# Patient Record
Sex: Female | Born: 1990 | Race: Black or African American | Hispanic: No | Marital: Single | Smoking: Former smoker
Health system: Southern US, Community
[De-identification: ages and names within clinical notes are randomized; demographics above are authoritative.]

## PROBLEM LIST (undated history)

## (undated) ENCOUNTER — Inpatient Hospital Stay (HOSPITAL_COMMUNITY): Payer: Self-pay

## (undated) DIAGNOSIS — F909 Attention-deficit hyperactivity disorder, unspecified type: Secondary | ICD-10-CM

## (undated) DIAGNOSIS — F32A Depression, unspecified: Secondary | ICD-10-CM

## (undated) DIAGNOSIS — E282 Polycystic ovarian syndrome: Secondary | ICD-10-CM

## (undated) DIAGNOSIS — E119 Type 2 diabetes mellitus without complications: Secondary | ICD-10-CM

## (undated) DIAGNOSIS — B009 Herpesviral infection, unspecified: Secondary | ICD-10-CM

## (undated) DIAGNOSIS — I10 Essential (primary) hypertension: Secondary | ICD-10-CM

## (undated) DIAGNOSIS — F419 Anxiety disorder, unspecified: Secondary | ICD-10-CM

## (undated) HISTORY — DX: Polycystic ovarian syndrome: E28.2

## (undated) HISTORY — DX: Herpesviral infection, unspecified: B00.9

## (undated) HISTORY — DX: Anxiety disorder, unspecified: F41.9

## (undated) HISTORY — PX: SHOULDER SURGERY: SHX246

## (undated) HISTORY — DX: Depression, unspecified: F32.A

## (undated) HISTORY — DX: Essential (primary) hypertension: I10

## (undated) HISTORY — DX: Type 2 diabetes mellitus without complications: E11.9

---

## 2017-10-30 DIAGNOSIS — L309 Dermatitis, unspecified: Secondary | ICD-10-CM | POA: Insufficient documentation

## 2019-07-31 ENCOUNTER — Encounter (HOSPITAL_COMMUNITY): Payer: Self-pay | Admitting: Emergency Medicine

## 2019-07-31 ENCOUNTER — Other Ambulatory Visit: Payer: Self-pay

## 2019-07-31 ENCOUNTER — Emergency Department (HOSPITAL_COMMUNITY)
Admission: EM | Admit: 2019-07-31 | Discharge: 2019-07-31 | Disposition: A | Discharge status: Home / Self Care | Payer: 59 | Attending: Emergency Medicine | Admitting: Emergency Medicine

## 2019-07-31 ENCOUNTER — Emergency Department (HOSPITAL_COMMUNITY): Payer: 59

## 2019-07-31 DIAGNOSIS — Z79899 Other long term (current) drug therapy: Secondary | ICD-10-CM | POA: Insufficient documentation

## 2019-07-31 DIAGNOSIS — Y999 Unspecified external cause status: Secondary | ICD-10-CM | POA: Insufficient documentation

## 2019-07-31 DIAGNOSIS — F909 Attention-deficit hyperactivity disorder, unspecified type: Secondary | ICD-10-CM | POA: Insufficient documentation

## 2019-07-31 DIAGNOSIS — S40211A Abrasion of right shoulder, initial encounter: Secondary | ICD-10-CM | POA: Insufficient documentation

## 2019-07-31 DIAGNOSIS — R51 Headache: Secondary | ICD-10-CM | POA: Insufficient documentation

## 2019-07-31 DIAGNOSIS — S161XXA Strain of muscle, fascia and tendon at neck level, initial encounter: Secondary | ICD-10-CM | POA: Diagnosis not present

## 2019-07-31 DIAGNOSIS — Y939 Activity, unspecified: Secondary | ICD-10-CM | POA: Insufficient documentation

## 2019-07-31 DIAGNOSIS — S50811A Abrasion of right forearm, initial encounter: Secondary | ICD-10-CM | POA: Diagnosis not present

## 2019-07-31 DIAGNOSIS — R109 Unspecified abdominal pain: Secondary | ICD-10-CM | POA: Insufficient documentation

## 2019-07-31 DIAGNOSIS — S80212A Abrasion, left knee, initial encounter: Secondary | ICD-10-CM | POA: Insufficient documentation

## 2019-07-31 DIAGNOSIS — S80211A Abrasion, right knee, initial encounter: Secondary | ICD-10-CM | POA: Insufficient documentation

## 2019-07-31 DIAGNOSIS — Y929 Unspecified place or not applicable: Secondary | ICD-10-CM | POA: Diagnosis not present

## 2019-07-31 DIAGNOSIS — M542 Cervicalgia: Secondary | ICD-10-CM | POA: Diagnosis present

## 2019-07-31 DIAGNOSIS — S0181XA Laceration without foreign body of other part of head, initial encounter: Secondary | ICD-10-CM | POA: Insufficient documentation

## 2019-07-31 DIAGNOSIS — T148XXA Other injury of unspecified body region, initial encounter: Secondary | ICD-10-CM

## 2019-07-31 DIAGNOSIS — R0789 Other chest pain: Secondary | ICD-10-CM | POA: Diagnosis not present

## 2019-07-31 HISTORY — DX: Attention-deficit hyperactivity disorder, unspecified type: F90.9

## 2019-07-31 LAB — CBC WITH DIFFERENTIAL/PLATELET
Abs Immature Granulocytes: 0.03 10*3/uL (ref 0.00–0.07)
Basophils Absolute: 0 10*3/uL (ref 0.0–0.1)
Basophils Relative: 0 %
Eosinophils Absolute: 0.1 10*3/uL (ref 0.0–0.5)
Eosinophils Relative: 2 %
HCT: 41.4 % (ref 36.0–46.0)
Hemoglobin: 13 g/dL (ref 12.0–15.0)
Immature Granulocytes: 1 %
Lymphocytes Relative: 43 %
Lymphs Abs: 2.6 10*3/uL (ref 0.7–4.0)
MCH: 26.2 pg (ref 26.0–34.0)
MCHC: 31.4 g/dL (ref 30.0–36.0)
MCV: 83.5 fL (ref 80.0–100.0)
Monocytes Absolute: 0.4 10*3/uL (ref 0.1–1.0)
Monocytes Relative: 7 %
Neutro Abs: 3 10*3/uL (ref 1.7–7.7)
Neutrophils Relative %: 47 %
Platelets: 377 10*3/uL (ref 150–400)
RBC: 4.96 MIL/uL (ref 3.87–5.11)
RDW: 13.2 % (ref 11.5–15.5)
WBC: 6.2 10*3/uL (ref 4.0–10.5)
nRBC: 0 % (ref 0.0–0.2)

## 2019-07-31 LAB — COMPREHENSIVE METABOLIC PANEL
ALT: 17 U/L (ref 0–44)
AST: 23 U/L (ref 15–41)
Albumin: 4.1 g/dL (ref 3.5–5.0)
Alkaline Phosphatase: 65 U/L (ref 38–126)
Anion gap: 11 (ref 5–15)
BUN: 15 mg/dL (ref 6–20)
CO2: 22 mmol/L (ref 22–32)
Calcium: 9.6 mg/dL (ref 8.9–10.3)
Chloride: 106 mmol/L (ref 98–111)
Creatinine, Ser: 1.21 mg/dL — ABNORMAL HIGH (ref 0.44–1.00)
GFR calc Af Amer: >60 mL/min (ref >60)
GFR calc non Af Amer: >60 mL/min (ref >60)
Glucose, Bld: 122 mg/dL — ABNORMAL HIGH (ref 70–99)
Potassium: 3.8 mmol/L (ref 3.5–5.1)
Sodium: 139 mmol/L (ref 135–145)
Total Bilirubin: 0.2 mg/dL — ABNORMAL LOW (ref 0.3–1.2)
Total Protein: 7.3 g/dL (ref 6.5–8.1)

## 2019-07-31 LAB — RAPID URINE DRUG SCREEN, HOSP PERFORMED
Amphetamines: POSITIVE — AB
Barbiturates: NOT DETECTED
Benzodiazepines: NOT DETECTED
Cocaine: NOT DETECTED
Opiates: NOT DETECTED
Tetrahydrocannabinol: NOT DETECTED

## 2019-07-31 LAB — I-STAT BETA HCG BLOOD, ED (MC, WL, AP ONLY): I-stat hCG, quantitative: <5 m[IU]/mL (ref <5)

## 2019-07-31 LAB — ETHANOL: Alcohol, Ethyl (B): 17 mg/dL — ABNORMAL HIGH (ref <10)

## 2019-07-31 MED ORDER — HYDROCODONE-ACETAMINOPHEN 5-325 MG PO TABS: 1.0000 | ORAL_TABLET | ORAL | 0 refills | Status: DC | PRN

## 2019-07-31 MED ORDER — METHOCARBAMOL 500 MG PO TABS: 500.0000 mg | ORAL_TABLET | Freq: Three times a day (TID) | ORAL | 0 refills | Status: DC | PRN

## 2019-07-31 MED ORDER — LIDOCAINE-EPINEPHRINE (PF) 2 %-1:200000 IJ SOLN
10.0000 mL | Freq: Once | INTRAMUSCULAR | Status: AC
  Administered 2019-07-31: 10 mL
  Filled 2019-07-31: qty 20

## 2019-07-31 MED ORDER — IOHEXOL 300 MG/ML  SOLN
100.0000 mL | Freq: Once | INTRAMUSCULAR | Status: AC | PRN
  Administered 2019-07-31: 100 mL via INTRAVENOUS

## 2019-07-31 NOTE — ED Notes (Signed)
Patient verbalizes understanding of discharge instructions . Opportunity for questions and answers were provided . Armband removed by staff ,Pt discharged from ED. W/C  offered at D/C  and Declined W/C at D/C and was escorted to lobby by RN.  
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
## 2019-07-31 NOTE — ED Notes (Signed)
Patient transported to CT SCAN . 
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
## 2019-08-19 DIAGNOSIS — F909 Attention-deficit hyperactivity disorder, unspecified type: Secondary | ICD-10-CM | POA: Insufficient documentation

## 2019-08-19 DIAGNOSIS — F419 Anxiety disorder, unspecified: Secondary | ICD-10-CM | POA: Insufficient documentation
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
## 2021-07-04 ENCOUNTER — Encounter (HOSPITAL_BASED_OUTPATIENT_CLINIC_OR_DEPARTMENT_OTHER): Payer: Self-pay

## 2021-07-04 ENCOUNTER — Emergency Department (HOSPITAL_BASED_OUTPATIENT_CLINIC_OR_DEPARTMENT_OTHER)
Admission: EM | Admit: 2021-07-04 | Discharge: 2021-07-04 | Disposition: A | Discharge status: Home / Self Care | Payer: No Typology Code available for payment source | Attending: Emergency Medicine | Admitting: Emergency Medicine

## 2021-07-04 ENCOUNTER — Other Ambulatory Visit: Payer: Self-pay

## 2021-07-04 DIAGNOSIS — Z79899 Other long term (current) drug therapy: Secondary | ICD-10-CM | POA: Diagnosis not present

## 2021-07-04 DIAGNOSIS — F1010 Alcohol abuse, uncomplicated: Secondary | ICD-10-CM | POA: Diagnosis not present

## 2021-07-04 DIAGNOSIS — R1084 Generalized abdominal pain: Secondary | ICD-10-CM | POA: Insufficient documentation

## 2021-07-04 DIAGNOSIS — R112 Nausea with vomiting, unspecified: Secondary | ICD-10-CM | POA: Insufficient documentation

## 2021-07-04 DIAGNOSIS — Y900 Blood alcohol level of less than 20 mg/100 ml: Secondary | ICD-10-CM | POA: Diagnosis not present

## 2021-07-04 DIAGNOSIS — R519 Headache, unspecified: Secondary | ICD-10-CM | POA: Diagnosis not present

## 2021-07-04 DIAGNOSIS — F17210 Nicotine dependence, cigarettes, uncomplicated: Secondary | ICD-10-CM | POA: Insufficient documentation

## 2021-07-04 LAB — CBC
HCT: 42.4 % (ref 36.0–46.0)
Hemoglobin: 13.7 g/dL (ref 12.0–15.0)
MCH: 26.8 pg (ref 26.0–34.0)
MCHC: 32.3 g/dL (ref 30.0–36.0)
MCV: 82.8 fL (ref 80.0–100.0)
Platelets: 376 10*3/uL (ref 150–400)
RBC: 5.12 MIL/uL — ABNORMAL HIGH (ref 3.87–5.11)
RDW: 13.3 % (ref 11.5–15.5)
WBC: 7.1 10*3/uL (ref 4.0–10.5)
nRBC: 0 % (ref 0.0–0.2)

## 2021-07-04 LAB — LIPASE, BLOOD: Lipase: 23 U/L (ref 11–51)

## 2021-07-04 LAB — COMPREHENSIVE METABOLIC PANEL
ALT: 25 U/L (ref 0–44)
AST: 26 U/L (ref 15–41)
Albumin: 4.4 g/dL (ref 3.5–5.0)
Alkaline Phosphatase: 67 U/L (ref 38–126)
Anion gap: 10 (ref 5–15)
BUN: 14 mg/dL (ref 6–20)
CO2: 24 mmol/L (ref 22–32)
Calcium: 8.9 mg/dL (ref 8.9–10.3)
Chloride: 105 mmol/L (ref 98–111)
Creatinine, Ser: 0.91 mg/dL (ref 0.44–1.00)
GFR, Estimated: >60 mL/min (ref >60)
Glucose, Bld: 132 mg/dL — ABNORMAL HIGH (ref 70–99)
Potassium: 4.2 mmol/L (ref 3.5–5.1)
Sodium: 139 mmol/L (ref 135–145)
Total Bilirubin: 0.3 mg/dL (ref 0.3–1.2)
Total Protein: 7.9 g/dL (ref 6.5–8.1)

## 2021-07-04 LAB — URINALYSIS, ROUTINE W REFLEX MICROSCOPIC
Bilirubin Urine: NEGATIVE
Glucose, UA: NEGATIVE mg/dL
Hgb urine dipstick: NEGATIVE
Ketones, ur: NEGATIVE mg/dL
Leukocytes,Ua: NEGATIVE
Nitrite: NEGATIVE
Protein, ur: NEGATIVE mg/dL
Specific Gravity, Urine: 1.015 (ref 1.005–1.030)
pH: 8 (ref 5.0–8.0)

## 2021-07-04 LAB — ETHANOL: Alcohol, Ethyl (B): 15 mg/dL — ABNORMAL HIGH (ref <10)

## 2021-07-04 LAB — PREGNANCY, URINE: Preg Test, Ur: NEGATIVE

## 2021-07-04 LAB — RAPID URINE DRUG SCREEN, HOSP PERFORMED
Amphetamines: NOT DETECTED
Barbiturates: NOT DETECTED
Benzodiazepines: NOT DETECTED
Cocaine: NOT DETECTED
Opiates: NOT DETECTED
Tetrahydrocannabinol: NOT DETECTED

## 2021-07-04 MED ORDER — ONDANSETRON 4 MG PO TBDP: 4.0000 mg | ORAL_TABLET | Freq: Three times a day (TID) | ORAL | 0 refills | Status: DC | PRN

## 2021-07-04 MED ORDER — SODIUM CHLORIDE 0.9 % IV BOLUS
1000.0000 mL | Freq: Once | INTRAVENOUS | Status: AC
  Administered 2021-07-04: 1000 mL via INTRAVENOUS

## 2021-07-04 MED ORDER — PROCHLORPERAZINE 25 MG RE SUPP
25.0000 mg | Freq: Once | RECTAL | Status: AC
  Administered 2021-07-04: 25 mg via RECTAL
  Filled 2021-07-04: qty 1

## 2021-07-04 MED ORDER — ONDANSETRON HCL 4 MG/2ML IJ SOLN
4.0000 mg | Freq: Once | INTRAMUSCULAR | Status: AC
  Administered 2021-07-04: 4 mg via INTRAVENOUS
  Filled 2021-07-04: qty 2

## 2021-07-04 MED ORDER — ONDANSETRON 4 MG PO TBDP
4.0000 mg | ORAL_TABLET | Freq: Once | ORAL | Status: AC | PRN
  Administered 2021-07-04: 4 mg via ORAL
  Filled 2021-07-04 (×2): qty 1

## 2021-07-04 MED ORDER — PROMETHAZINE HCL 25 MG RE SUPP
25.0000 mg | Freq: Once | RECTAL | Status: DC
  Filled 2021-07-04: qty 1
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
## 2021-07-04 NOTE — Discharge Instructions (Signed)
Your labs today are reassuring.  Recommend home to rest, stick with clear liquid diet and advance slowly as tolerated.  Take Zofran as needed for nausea and vomiting.  Recheck with your primary care provider.

## 2022-08-16 DIAGNOSIS — F418 Other specified anxiety disorders: Secondary | ICD-10-CM | POA: Insufficient documentation

## 2022-08-16 DIAGNOSIS — E282 Polycystic ovarian syndrome: Secondary | ICD-10-CM | POA: Insufficient documentation

## 2022-08-25 ENCOUNTER — Inpatient Hospital Stay (HOSPITAL_COMMUNITY)
Admission: AD | Admit: 2022-08-25 | Discharge: 2022-08-25 | Disposition: A | Discharge status: Home / Self Care | Payer: No Typology Code available for payment source | Attending: Obstetrics and Gynecology | Admitting: Obstetrics and Gynecology

## 2022-08-25 ENCOUNTER — Inpatient Hospital Stay (HOSPITAL_COMMUNITY): Payer: No Typology Code available for payment source

## 2022-08-25 ENCOUNTER — Encounter (HOSPITAL_COMMUNITY): Payer: Self-pay | Admitting: *Deleted

## 2022-08-25 DIAGNOSIS — R109 Unspecified abdominal pain: Secondary | ICD-10-CM | POA: Insufficient documentation

## 2022-08-25 DIAGNOSIS — O26891 Other specified pregnancy related conditions, first trimester: Secondary | ICD-10-CM | POA: Insufficient documentation

## 2022-08-25 DIAGNOSIS — O10919 Unspecified pre-existing hypertension complicating pregnancy, unspecified trimester: Secondary | ICD-10-CM | POA: Diagnosis present

## 2022-08-25 DIAGNOSIS — O039 Complete or unspecified spontaneous abortion without complication: Secondary | ICD-10-CM | POA: Diagnosis not present

## 2022-08-25 DIAGNOSIS — Z3A01 Less than 8 weeks gestation of pregnancy: Secondary | ICD-10-CM | POA: Insufficient documentation

## 2022-08-25 DIAGNOSIS — O209 Hemorrhage in early pregnancy, unspecified: Secondary | ICD-10-CM

## 2022-08-25 LAB — CBC
HCT: 34.6 % — ABNORMAL LOW (ref 36.0–46.0)
Hemoglobin: 11.2 g/dL — ABNORMAL LOW (ref 12.0–15.0)
MCH: 26.7 pg (ref 26.0–34.0)
MCHC: 32.4 g/dL (ref 30.0–36.0)
MCV: 82.6 fL (ref 80.0–100.0)
Platelets: 314 10*3/uL (ref 150–400)
RBC: 4.19 MIL/uL (ref 3.87–5.11)
RDW: 13.8 % (ref 11.5–15.5)
WBC: 7.3 10*3/uL (ref 4.0–10.5)
nRBC: 0 % (ref 0.0–0.2)

## 2022-08-25 LAB — WET PREP, GENITAL
Sperm: NONE SEEN
Trich, Wet Prep: NONE SEEN
WBC, Wet Prep HPF POC: <10 (ref <10)
Yeast Wet Prep HPF POC: NONE SEEN

## 2022-08-25 LAB — ABO/RH: ABO/RH(D): A POS

## 2022-08-25 LAB — HCG, QUANTITATIVE, PREGNANCY: hCG, Beta Chain, Quant, S: 2225 m[IU]/mL — ABNORMAL HIGH (ref <5)

## 2022-08-25 LAB — HIV ANTIBODY (ROUTINE TESTING W REFLEX): HIV Screen 4th Generation wRfx: NONREACTIVE

## 2022-08-25 NOTE — MAU Note (Signed)
G1P0 at 6.5 weeks date by LMP and early [place].  Reports spotting for the last two weeks with increased bleeding heavier than a period today that has returned to spotting currently. Some mild cramping.

## 2022-08-25 NOTE — Discharge Instructions (Signed)
Return to MAU: If you have heavier bleeding that soaks through more that 2 pads per hour for an hour or more If you bleed so much that you feel like you might pass out or you do pass out If you have significant abdominal pain that is not improved with Tylenol 1000 mg every 8 hours as needed for pain If you develop a fever > 100.5  

## 2022-08-25 NOTE — MAU Note (Signed)
Tammy [name] is a 31 y.o. at Unknown here in MAU reporting: had heavy cramps yesterday.  Woke up this morning, had a lot of blood. ("Soaked everything", has pantiliner on) Has been having spotting every day- started when missed period, only was seeing when she used the bathroom., Called the midwife and was told to come here. (Has had [place], showed IUP per pt). Mild cramping today. BP was really high yesterday.  LMP: 7/25 Onset of complaint: yesterday Pain score: mild Vitals:   08/25/22 1000  BP: (!) 146/97  Pulse: 76  Resp: 18  Temp: 98 F (36.7 C)  SpO2: 100%      Lab orders placed from triage:

## 2022-08-25 NOTE — MAU Provider Note (Signed)
History     CSN: [id number]  Arrival date and time: 08/25/22 5638   Event Date/Time   First Provider Initiated Contact with Patient 08/25/22 1113      Chief Complaint  Patient presents with   Vaginal Bleeding   Abdominal Pain   HPI [name] is a 31 y.o. year old G1P0 female at [redacted]w[redacted]d weeks gestation who presents to MAU reporting "heavy abdominal cramps since yesterday." She reports she woke up this AM with "a lot" of blood; "soaked everything." She is wearing pantiliner into MAU. She reports spotting every day, but only when she wipes in the BR until today. She denies any recent SI. She has a picture of an U/S showing an IUP from [address] [facility] on her phone. She reports mild cramping today 1-2/10. Her pregnancy is complicated by cHTN (no meds). She receives [facility]; next appt is 09/06/2022.   **U/S at [facility] on 08/16/2022  OB History     Gravida  1   Para      Term      Preterm      AB      Living         SAB      IAB      Ectopic      Multiple      Live Births              Past Medical History:  Diagnosis Date   ADHD     Past Surgical History:  Procedure Laterality Date   SHOULDER SURGERY      History reviewed. No pertinent family history.  Social History   Tobacco Use   Smoking status: Every Day    Types: Cigarettes   Smokeless tobacco: Never  Substance Use Topics   Alcohol use: Yes    Comment: weekly   Drug use: Not Currently    Allergies: No Known Allergies  No medications prior to admission.    Review of Systems  Constitutional: Negative.   HENT: Negative.    Eyes: Negative.   Respiratory: Negative.    Cardiovascular: Negative.   Gastrointestinal: Negative.   Endocrine: Negative.   Genitourinary:  Positive for pelvic pain (mild cramping) and vaginal bleeding ("soaking everything").  Musculoskeletal: Negative.   Skin: Negative.   Allergic/Immunologic: Negative.   Neurological:  Negative.   Hematological: Negative.   Psychiatric/Behavioral: Negative.     Physical Exam   Blood pressure (!) 156/97, pulse 95, temperature 98 F (36.7 C), temperature source Oral, resp. rate 17, height 5\' 8"  (1.727 m), weight 111.8 kg, last menstrual period 07/09/2022, SpO2 99 %.  Physical Exam Vitals and nursing note reviewed. Exam conducted with a chaperone present.  Constitutional:      Appearance: Normal appearance. She is obese.  Cardiovascular:     Rate and Rhythm: Normal rate.  Pulmonary:     Effort: Pulmonary effort is normal.  Abdominal:     Palpations: Abdomen is soft.  Skin:    General: Skin is warm and dry.  Neurological:     Mental Status: She is alert and oriented to person, place, and time.  Psychiatric:        Mood and Affect: Mood normal.        Behavior: Behavior normal.        Thought Content: Thought content normal.        Judgment: Judgment normal.     MAU Course  Procedures  MDM [facility] UPT CBC  ABO/Rh HCG Wet Prep GC/CT -- pending HIV -- pending OB < 14 wks [place] with TV  Results for orders placed or performed during the hospital encounter of 08/25/22 (from the past 24 hour(s))  Wet prep, genital     Status: Abnormal   Collection Time: 08/25/22 11:23 AM   Specimen: Vaginal  Result Value Ref Range   Yeast Wet Prep HPF POC NONE SEEN NONE SEEN   Trich, Wet Prep NONE SEEN NONE SEEN   Clue Cells Wet Prep HPF POC PRESENT (A) NONE SEEN   WBC, Wet Prep HPF POC <10 <10   Sperm NONE SEEN   CBC     Status: Abnormal   Collection Time: 08/25/22 11:38 AM  Result Value Ref Range   WBC 7.3 4.0 - 10.5 K/uL   RBC 4.19 3.87 - 5.11 MIL/uL   Hemoglobin 11.2 (L) 12.0 - 15.0 g/dL   HCT 34.6 (L) 36.0 - 46.0 %   MCV 82.6 80.0 - 100.0 fL   MCH 26.7 26.0 - 34.0 pg   MCHC 32.4 30.0 - 36.0 g/dL   RDW 13.8 11.5 - 15.5 %   Platelets 314 150 - 400 K/uL   nRBC 0.0 0.0 - 0.2 %  ABO/Rh     Status: None   Collection Time: 08/25/22 11:38 AM  Result Value Ref Range    ABO/RH(D) A POS    No rh immune globuloin      NOT A RH IMMUNE GLOBULIN CANDIDATE, PT RH POSITIVE Performed at [facility], [place] [address] [place], [place] [address]   hCG, quantitative, pregnancy     Status: Abnormal   Collection Time: 08/25/22 11:38 AM  Result Value Ref Range   hCG, Beta Chain, Quant, S 2,225 (H) <5 mIU/mL  HIV Antibody (routine testing w rflx)     Status: None   Collection Time: 08/25/22 11:38 AM  Result Value Ref Range   HIV Screen 4th Generation wRfx Non Reactive Non Reactive    US OB LESS THAN 14 WEEKS WITH OB TRANSVAGINAL  Result Date: 08/25/2022 CLINICAL DATA:  Vaginal bleeding in 1st trimester pregnancy. EXAM: OBSTETRIC <14 WK [place] AND TRANSVAGINAL OB US TECHNIQUE: Both transabdominal and transvaginal ultrasound examinations were performed for complete evaluation of the gestation as well as the maternal uterus, adnexal regions, and pelvic cul-de-sac. Transvaginal technique was performed to assess early pregnancy. COMPARISON:  None Available. FINDINGS: Intrauterine gestational sac: None Maternal uterus/adnexae: Endometrium is heterogeneous in appearance and measures 15 mm in thickness. Both ovaries are normal in appearance. No adnexal mass or abnormal free fluid identified. IMPRESSION: Pregnancy of unknown anatomic location (no intrauterine gestational sac or adnexal mass identified). Differential diagnosis includes recent spontaneous abortion, IUP too early to visualize, and non-visualized ectopic pregnancy. Recommend correlation with serial beta-hCG levels, and follow up US if warranted clinically. Electronically Signed   By: Marlaine Hind M.D.   On: 08/25/2022 12:28      Assessment and Plan  1. Miscarriage - Information provided on miscarriage and managing pregnancy loss  2. Vaginal bleeding affecting early pregnancy - Return to MAU: If you have heavier bleeding that soaks through more that 2 pads per hour for an hour or more If you bleed so much that  you feel like you might pass out or you do pass out If you have significant abdominal pain that is not improved with Tylenol 1000 mg every 8 hours as needed for pain If you develop a fever > 100.5   3.  [redacted] weeks gestation of pregnancy   - Discharge patient - Will need to f/u with [facility] on 09/02/2022 to start weekly HCG levels - Patient verbalized an understanding of the plan of care and agrees.   Raelyn Mora, CNM 08/25/2022, 11:13 AM

## 2022-08-26 LAB — GC/CHLAMYDIA PROBE AMP (~~LOC~~) NOT AT ARMC
Chlamydia: NEGATIVE
Comment: NEGATIVE
Comment: NORMAL
Neisseria Gonorrhea: NEGATIVE

## 2022-08-28 DIAGNOSIS — A6000 Herpesviral infection of urogenital system, unspecified: Secondary | ICD-10-CM | POA: Insufficient documentation

## 2022-12-16 NOTE — L&D Delivery Note (Signed)
Delivery Note  Called to room due to patient feeling the urge to push. Upon my arrival, the fetus was delivered and at the perineum. Cord doubly clamped and cut. Due to the fragility of the umbilical cord, it avulsed from the placenta in an attempt to move it. Hemabate IM x 1 dose was given to aid with delivery of the placenta. Placenta still in utero. Cervix, vagina, perineum and labia were inspected and no lacerations noted. Uterus fundus firm.  After about 10 minutes, her pads were weighed after continual passage of blood clots. The current QBL is 1000cc. Patient re-evaluated at bedside. Bedside US performed and unable to visualize the placenta within the endometrial cavity. On exam, placenta and blood clot palpated coming through the cervical os. No active hemorrhage but blood clots were extracted. Another dose of Hemabate IM x 1 was given. IVF 1L was started. Patient desired to sit on the commode to aid with pushing out the placenta and was assisted to the bathroom.  I counseled the patient that if placenta does not delivery after second dose of Hemabate, recommend Dilation and suction curettage. We discussed the risk for infection, bleeding, injury to surrounding organs, and risk for the need for blood transfusion. Patient was consented for blood products.  The patient is aware that bleeding may result in the need for a blood transfusion which includes risk of transmission of HIV (1:2 million), Hepatitis C (1:2 million), and Hepatitis B (1:200 thousand) and transfusion reaction.  Patient voiced understanding of the above risks as well as understanding of indications for blood transfusion.  Patient is agreeable to proceed with Dilation and Suction curettage if necessary. Dr. Reesa Chew arrived to assume care - see her note for further details.  Steva Ready, DO

## 2023-02-10 DIAGNOSIS — G43109 Migraine with aura, not intractable, without status migrainosus: Secondary | ICD-10-CM | POA: Insufficient documentation

## 2023-06-23 ENCOUNTER — Inpatient Hospital Stay (HOSPITAL_COMMUNITY)
Admission: AD | Admit: 2023-06-23 | Discharge: 2023-06-23 | Disposition: A | Discharge status: Home / Self Care | Payer: Medicaid Other | Attending: Family Medicine | Admitting: Family Medicine

## 2023-06-23 ENCOUNTER — Encounter (HOSPITAL_COMMUNITY): Payer: Self-pay | Admitting: *Deleted

## 2023-06-23 ENCOUNTER — Inpatient Hospital Stay (HOSPITAL_COMMUNITY): Payer: Medicaid Other

## 2023-06-23 DIAGNOSIS — O26891 Other specified pregnancy related conditions, first trimester: Secondary | ICD-10-CM | POA: Insufficient documentation

## 2023-06-23 DIAGNOSIS — O23591 Infection of other part of genital tract in pregnancy, first trimester: Secondary | ICD-10-CM | POA: Insufficient documentation

## 2023-06-23 DIAGNOSIS — O209 Hemorrhage in early pregnancy, unspecified: Secondary | ICD-10-CM | POA: Diagnosis not present

## 2023-06-23 DIAGNOSIS — B9689 Other specified bacterial agents as the cause of diseases classified elsewhere: Secondary | ICD-10-CM | POA: Diagnosis not present

## 2023-06-23 DIAGNOSIS — N760 Acute vaginitis: Secondary | ICD-10-CM

## 2023-06-23 DIAGNOSIS — Z3A13 13 weeks gestation of pregnancy: Secondary | ICD-10-CM | POA: Insufficient documentation

## 2023-06-23 LAB — WET PREP, GENITAL
Sperm: NONE SEEN
Trich, Wet Prep: NONE SEEN
WBC, Wet Prep HPF POC: >=10 — AB (ref <10)
Yeast Wet Prep HPF POC: NONE SEEN

## 2023-06-23 LAB — URINALYSIS, ROUTINE W REFLEX MICROSCOPIC
Bilirubin Urine: NEGATIVE
Glucose, UA: NEGATIVE mg/dL
Hgb urine dipstick: NEGATIVE
Ketones, ur: NEGATIVE mg/dL
Leukocytes,Ua: NEGATIVE
Nitrite: NEGATIVE
Protein, ur: NEGATIVE mg/dL
Specific Gravity, Urine: 1.027 (ref 1.005–1.030)
pH: 5 (ref 5.0–8.0)

## 2023-06-23 LAB — POCT PREGNANCY, URINE: Preg Test, Ur: POSITIVE — AB

## 2023-06-23 MED ORDER — METRONIDAZOLE 500 MG PO TABS: 500.0000 mg | ORAL_TABLET | Freq: Two times a day (BID) | ORAL | 0 refills | Status: AC

## 2023-06-23 MED ORDER — METRONIDAZOLE 500 MG PO TABS: 500.0000 mg | ORAL_TABLET | Freq: Two times a day (BID) | ORAL | 0 refills | Status: DC

## 2023-06-23 NOTE — MAU Note (Signed)
Pt says she has vag spotting- brown - when she wipes- started spotting since last cycle - x3 weeks  Cramping started today - had small blood clot- dime size .  2/10- no meds

## 2023-06-23 NOTE — MAU Provider Note (Signed)
History     [id number]  Arrival date and time: 06/23/23 1703    Chief Complaint  Patient presents with   Vaginal Bleeding   Abdominal Pain   HPI Tammy [name] is a 32 y.o. at [redacted]w[redacted]d by LMP who presents for vaginal bleeding and abdominal cramping. She reports experiencing mild vaginal bleeding for most of her pregnancy.  She would often have a blood tinge when she wipes after using the bathroom.  However, she was concerned because she had some small blood clots earlier today and experienced abdominal cramping. She spoke with her OB office ([facility]) and was told to come in for evaluation.   Review of outside prenatal records from [employer] (in media tab): no records available for review.  --/--/A POS (09/10 1138)  OB History     Gravida  2   Para      Term      Preterm      AB      Living         SAB      IAB      Ectopic      Multiple      Live Births              Past Medical History:  Diagnosis Date   ADHD     Past Surgical History:  Procedure Laterality Date   SHOULDER SURGERY      History reviewed. No pertinent family history.  Social History   Socioeconomic History   Marital status: Single    Spouse name: Not on file   Number of children: Not on file   Years of education: Not on file   Highest education level: Not on file  Occupational History   Not on file  Tobacco Use   Smoking status: Every Day    Types: Cigarettes   Smokeless tobacco: Never  Substance and Sexual Activity   Alcohol use: Yes    Comment: weekly   Drug use: Not Currently   Sexual activity: Not on file  Other Topics Concern   Not on file  Social History Narrative   Not on file   Social Determinants of Health   Financial Resource Strain: Not on file  Food Insecurity: Not on file  Transportation Needs: Not on file  Physical Activity: Not on file  Stress: Not on file  Social Connections: Not on file  Intimate Partner Violence: Not on file    No Known  Allergies  No current facility-administered medications on file prior to encounter.   Current Outpatient Medications on File Prior to Encounter  Medication Sig Dispense Refill   ADDERALL XR 25 MG 24 hr capsule Take 1 tablet by mouth daily.     buPROPion (WELLBUTRIN XL) 150 MG 24 hr tablet Take 1 tablet by mouth daily.     ondansetron (ZOFRAN ODT) 4 MG disintegrating tablet Take 1 tablet (4 mg total) by mouth every 8 (eight) hours as needed for nausea or vomiting. 12 tablet 0     Review of Systems  Constitutional:  Negative for chills and fever.  Eyes:  Negative for blurred vision.  Cardiovascular:  Negative for leg swelling.  Gastrointestinal:  Positive for abdominal pain. Negative for vomiting.  Genitourinary:  Negative for dysuria, frequency and urgency.  Musculoskeletal:  Negative for myalgias.  Skin:  Negative for itching.  Neurological:  Negative for dizziness.   Pertinent positives and negative per HPI, all others reviewed and negative  Physical Exam  BP 116/68 (BP Location: Right Arm)   Pulse 73   Temp 98.4 F (36.9 C) (Oral)   Resp 16   Ht 5\' 8"  (1.727 m)   Wt 112.9 kg   LMP 03/23/2023   Breastfeeding Unknown   BMI 37.86 kg/m   Patient Vitals for the past 24 hrs:  BP Temp Temp src Pulse Resp Height Weight  06/23/23 1822 116/68 98.4 F (36.9 C) Oral 73 16 5\' 8"  (1.727 m) 112.9 kg    Physical Exam Vitals reviewed.  Constitutional:      General: She is not in acute distress.    Appearance: She is well-developed. She is not toxic-appearing.  HENT:     Head: Normocephalic and atraumatic.     Mouth/Throat:     Mouth: Mucous membranes are moist.  Eyes:     Extraocular Movements: Extraocular movements intact.  Cardiovascular:     Rate and Rhythm: Normal rate.  Pulmonary:     Effort: Pulmonary effort is normal. No respiratory distress.  Abdominal:     Palpations: Abdomen is soft.     Tenderness: There is no abdominal tenderness.  Skin:    General: Skin  is warm and dry.  Neurological:     Mental Status: She is alert and oriented to person, place, and time.  Psychiatric:        Mood and Affect: Mood normal.        Behavior: Behavior normal.     Labs Results for orders placed or performed during the hospital encounter of 06/23/23 (from the past 24 hour(s))  Pregnancy, urine POC     Status: Abnormal   Collection Time: 06/23/23  5:28 PM  Result Value Ref Range   Preg Test, Ur POSITIVE (A) NEGATIVE  Urinalysis, Routine w reflex microscopic -Urine, Clean Catch     Status: None   Collection Time: 06/23/23  5:50 PM  Result Value Ref Range   Color, Urine YELLOW YELLOW   APPearance CLEAR CLEAR   Specific Gravity, Urine 1.027 1.005 - 1.030   pH 5.0 5.0 - 8.0   Glucose, UA NEGATIVE NEGATIVE mg/dL   Hgb urine dipstick NEGATIVE NEGATIVE   Bilirubin Urine NEGATIVE NEGATIVE   Ketones, ur NEGATIVE NEGATIVE mg/dL   Protein, ur NEGATIVE NEGATIVE mg/dL   Nitrite NEGATIVE NEGATIVE   Leukocytes,Ua NEGATIVE NEGATIVE  Wet prep, genital     Status: Abnormal   Collection Time: 06/23/23  6:27 PM  Result Value Ref Range   Yeast Wet Prep HPF POC NONE SEEN NONE SEEN   Trich, Wet Prep NONE SEEN NONE SEEN   Clue Cells Wet Prep HPF POC PRESENT (A) NONE SEEN   WBC, Wet Prep HPF POC >=10 (A) <10   Sperm NONE SEEN     Imaging US OB Comp Less 14 Wks  Result Date: 06/23/2023 CLINICAL DATA:  Spotting x3 weeks. EXAM: OBSTETRIC <14 WK ULTRASOUND TECHNIQUE: Transabdominal ultrasound was performed for evaluation of the gestation as well as the maternal uterus and adnexal regions. COMPARISON:  August 25, 2022 FINDINGS: Intrauterine gestational sac: Single Yolk sac:  Not Visualized. Embryo:  Visualized. Cardiac Activity: Visualized. Heart Rate: 151 bpm CRL:   59.3 mm   12 w 3 d                  [place] EDC: January 02, 2024 Subchorionic hemorrhage:  None visualized. Maternal uterus/adnexae: The bilateral ovaries are visualized and are normal in appearance. No pelvic free  fluid is seen. IMPRESSION: Single, viable  intrauterine pregnancy at approximately 12 weeks and 3 days gestation by ultrasound evaluation. Electronically Signed   By: Aram Candela M.D.   On: 06/23/2023 20:16    MAU Course  Procedures Lab Orders         Wet prep, genital         Urinalysis, Routine w reflex microscopic -Urine, Clean Catch         Pregnancy, urine POC    Meds ordered this encounter  Medications   DISCONTD: metroNIDAZOLE (FLAGYL) 500 MG tablet    Sig: Take 1 tablet (500 mg total) by mouth 2 (two) times daily for 7 days.    Dispense:  14 tablet    Refill:  0   metroNIDAZOLE (FLAGYL) 500 MG tablet    Sig: Take 1 tablet (500 mg total) by mouth 2 (two) times daily for 7 days.    Dispense:  14 tablet    Refill:  0   Imaging Orders         US OB Comp Less 14 Wks     MDM Moderate (Level 3-4)  Assessment and Plan  #Vaginal bleeding affecting early pregnancy [redacted] weeks gestation of pregnancy [place] reassuring for healthy intrauterine pregnancy. Dates consistent with LMP. - continue to monitor - follow up with OB provider - Discussed return precautions  Bacterial vaginosis -rx for metronidazole sent.  Dispo: discharged to home in stable condition   Allergies as of 06/23/2023   No Known Allergies      Medication List     TAKE these medications    Adderall XR 25 MG 24 hr capsule Generic drug: amphetamine-dextroamphetamine Take 1 tablet by mouth daily.   buPROPion 150 MG 24 hr tablet Commonly known as: WELLBUTRIN XL Take 1 tablet by mouth daily.   metroNIDAZOLE 500 MG tablet Commonly known as: Flagyl Take 1 tablet (500 mg total) by mouth 2 (two) times daily for 7 days.   ondansetron 4 MG disintegrating tablet Commonly known as: Zofran ODT Take 1 tablet (4 mg total) by mouth every 8 (eight) hours as needed for nausea or vomiting.        Sheppard Evens MD MPH OB Fellow, Faculty Practice [facility] 06/23/2023

## 2023-06-24 LAB — GC/CHLAMYDIA PROBE AMP (~~LOC~~) NOT AT ARMC
Chlamydia: NEGATIVE
Comment: NEGATIVE
Comment: NORMAL
Neisseria Gonorrhea: NEGATIVE

## 2023-07-08 ENCOUNTER — Other Ambulatory Visit: Payer: Self-pay | Admitting: Obstetrics and Gynecology

## 2023-07-08 ENCOUNTER — Telehealth: Payer: Self-pay

## 2023-07-08 DIAGNOSIS — I87309 Chronic venous hypertension (idiopathic) without complications of unspecified lower extremity: Secondary | ICD-10-CM

## 2023-07-15 ENCOUNTER — Other Ambulatory Visit: Payer: Self-pay

## 2023-07-16 ENCOUNTER — Inpatient Hospital Stay (HOSPITAL_COMMUNITY)
Admission: AD | Admit: 2023-07-16 | Discharge: 2023-07-19 | DRG: 805 | Disposition: A | Discharge status: Home / Self Care | Payer: Medicaid Other | Attending: Obstetrics and Gynecology | Admitting: Obstetrics and Gynecology

## 2023-07-16 ENCOUNTER — Encounter (HOSPITAL_COMMUNITY): Payer: Self-pay | Admitting: Obstetrics and Gynecology

## 2023-07-16 ENCOUNTER — Other Ambulatory Visit: Payer: Self-pay

## 2023-07-16 DIAGNOSIS — O9852 Other viral diseases complicating childbirth: Secondary | ICD-10-CM | POA: Diagnosis present

## 2023-07-16 DIAGNOSIS — O99214 Obesity complicating childbirth: Secondary | ICD-10-CM | POA: Diagnosis present

## 2023-07-16 DIAGNOSIS — O42919 Preterm premature rupture of membranes, unspecified as to length of time between rupture and onset of labor, unspecified trimester: Principal | ICD-10-CM

## 2023-07-16 DIAGNOSIS — F909 Attention-deficit hyperactivity disorder, unspecified type: Secondary | ICD-10-CM | POA: Diagnosis present

## 2023-07-16 DIAGNOSIS — O021 Missed abortion: Secondary | ICD-10-CM | POA: Diagnosis present

## 2023-07-16 DIAGNOSIS — Z8616 Personal history of COVID-19: Secondary | ICD-10-CM

## 2023-07-16 DIAGNOSIS — W010XXA Fall on same level from slipping, tripping and stumbling without subsequent striking against object, initial encounter: Secondary | ICD-10-CM | POA: Diagnosis not present

## 2023-07-16 DIAGNOSIS — O9832 Other infections with a predominantly sexual mode of transmission complicating childbirth: Secondary | ICD-10-CM | POA: Diagnosis present

## 2023-07-16 DIAGNOSIS — O4290 Premature rupture of membranes, unspecified as to length of time between rupture and onset of labor, unspecified weeks of gestation: Secondary | ICD-10-CM | POA: Diagnosis present

## 2023-07-16 DIAGNOSIS — F32A Depression, unspecified: Secondary | ICD-10-CM | POA: Diagnosis present

## 2023-07-16 DIAGNOSIS — O034 Incomplete spontaneous abortion without complication: Secondary | ICD-10-CM

## 2023-07-16 DIAGNOSIS — Y92231 Patient bathroom in hospital as the place of occurrence of the external cause: Secondary | ICD-10-CM | POA: Diagnosis not present

## 2023-07-16 DIAGNOSIS — N96 Recurrent pregnancy loss: Secondary | ICD-10-CM

## 2023-07-16 DIAGNOSIS — Y93F9 Activity, other caregiving: Secondary | ICD-10-CM

## 2023-07-16 DIAGNOSIS — A6000 Herpesviral infection of urogenital system, unspecified: Secondary | ICD-10-CM | POA: Diagnosis present

## 2023-07-16 DIAGNOSIS — O1092 Unspecified pre-existing hypertension complicating childbirth: Secondary | ICD-10-CM | POA: Diagnosis present

## 2023-07-16 DIAGNOSIS — D62 Acute posthemorrhagic anemia: Secondary | ICD-10-CM | POA: Diagnosis not present

## 2023-07-16 DIAGNOSIS — Z87891 Personal history of nicotine dependence: Secondary | ICD-10-CM

## 2023-07-16 DIAGNOSIS — F419 Anxiety disorder, unspecified: Secondary | ICD-10-CM | POA: Diagnosis present

## 2023-07-16 DIAGNOSIS — O42912 Preterm premature rupture of membranes, unspecified as to length of time between rupture and onset of labor, second trimester: Principal | ICD-10-CM | POA: Diagnosis present

## 2023-07-16 DIAGNOSIS — O10919 Unspecified pre-existing hypertension complicating pregnancy, unspecified trimester: Secondary | ICD-10-CM | POA: Diagnosis present

## 2023-07-16 DIAGNOSIS — Z3A16 16 weeks gestation of pregnancy: Secondary | ICD-10-CM

## 2023-07-16 DIAGNOSIS — O9081 Anemia of the puerperium: Secondary | ICD-10-CM | POA: Diagnosis not present

## 2023-07-16 DIAGNOSIS — U071 COVID-19: Secondary | ICD-10-CM

## 2023-07-16 DIAGNOSIS — O99344 Other mental disorders complicating childbirth: Secondary | ICD-10-CM | POA: Diagnosis present

## 2023-07-16 NOTE — MAU Provider Note (Signed)
Chief Complaint:  Rupture of Membranes and Abdominal Pain   Event Date/Time   First Provider Initiated Contact with Patient 07/16/23 2343     HPI: Baylen Schopf is a 32 y.o. G3P0020 at 86w3dwho presents to maternity admissions reporting having been diagnosed with Covid today.  Has fever and aches.  Then had some leaking this evening with pelvic cramping.  . She reports good fetal movement, denies LOF, vaginal bleeding, urinary symptoms, n/v, diarrhea, constipation.    Vaginal Discharge The patient's primary symptoms include pelvic pain and vaginal discharge. The patient's pertinent negatives include no vaginal bleeding. This is a new problem. The current episode started today. Associated symptoms include abdominal pain, a fever and headaches. Pertinent negatives include no chills, diarrhea, nausea or vomiting. The vaginal discharge was clear and watery. She has not been passing clots. She has not been passing tissue. Nothing aggravates the symptoms. She has tried nothing for the symptoms.   RN Note: Padme Popham is a 32 y.o. at [redacted]w[redacted]d here in MAU reporting: tested positive for covid thsi morning - had fever and body aches. Went to use the bathroom - started feeling a swelling a cramping, went to lay down and had a gush of liquid come out that completely soaked her underwear. Still having discomfort and cramping in lower abdomen. Still continuing to leak - fluid is clear. Denies VB.    Onset of complaint: 2230 Pain score: 8 - cramps.   Past Medical History: Past Medical History:  Diagnosis Date   ADHD     Past obstetric history: OB History  Gravida Para Term Preterm AB Living  3       2    SAB IAB Ectopic Multiple Live Births  2            # Outcome Date GA Lbr Len/2nd Weight Sex Type Anes PTL Lv  3 Current           2 SAB           1 SAB             Past Surgical History: Past Surgical History:  Procedure Laterality Date   SHOULDER SURGERY      Family History: History  reviewed. No pertinent family history.  Social History: Social History   Tobacco Use   Smoking status: Former    Types: Cigarettes   Smokeless tobacco: Never  Substance Use Topics   Alcohol use: Not Currently    Comment: weekly   Drug use: Not Currently    Allergies: No Known Allergies  Meds:  Medications Prior to Admission  Medication Sig Dispense Refill Last Dose   Prenatal Vit-Fe Fumarate-FA (PRENATAL MULTIVITAMIN) TABS tablet Take 1 tablet by mouth daily at 12 noon.   07/16/2023   ADDERALL XR 25 MG 24 hr capsule Take 1 tablet by mouth daily.      buPROPion (WELLBUTRIN XL) 150 MG 24 hr tablet Take 1 tablet by mouth daily.      ondansetron (ZOFRAN ODT) 4 MG disintegrating tablet Take 1 tablet (4 mg total) by mouth every 8 (eight) hours as needed for nausea or vomiting. 12 tablet 0     I have reviewed patient's Past Medical Hx, Surgical Hx, Family Hx, Social Hx, medications and allergies.   ROS:  Review of Systems  Constitutional:  Positive for fever. Negative for chills.  Gastrointestinal:  Positive for abdominal pain. Negative for diarrhea, nausea and vomiting.  Genitourinary:  Positive for pelvic pain and vaginal discharge.  Neurological:  Positive for headaches.   Other systems negative  Physical Exam  Patient Vitals for the past 24 hrs:  BP Temp Temp src Pulse Resp SpO2 Height Weight  07/16/23 2319 121/71 99.8 F (37.7 C) Oral (!) 105 18 98 % 5\' 8"  (1.727 m) 111.5 kg   Constitutional: Well-developed, well-nourished female in no acute distress.  Cardiovascular: normal rate and rhythm Respiratory: normal effort GI: Abd soft, non-tender, gravid appropriate for gestational age.   No rebound or guarding. MS: Extremities nontender, no edema, normal ROM Neurologic: Alert and oriented x 4.  GU: Neg CVAT.  PELVIC EXAM:  Sterile speculum exam showed dilated cervix with small parts visible.  Small amount of blood tinged watery fluid in vault, + ferning    Labs: No  results found for this or any previous visit (from the past 24 hour(s)).  --/--/A POS (09/10 1138)  Imaging:  Verbal report:   Fetus in cervix, no heartbeat visualized   MAU Course/MDM: I have reviewed the triage vital signs and the nursing notes.   Pertinent labs & imaging results that were available during my care of the patient were reviewed by me and considered in my medical decision making (see chart for details).      I have reviewed her medical records including past results, notes and treatments.   I have ordered labs and Ultrasound ordered by Dr Connye Burkitt  Consult Dr Connye Burkitt with presentation, exam findings and test results.  Treatments in MAU included IV, Analgesics, Ultrasound, SSE.    Assessment: Single IUP at [redacted]w[redacted]d Preterm premature rupture of membranes Inevitable Abortion COVID  Plan: Admit to [facility]  MD to follow.  Wynelle Bourgeois CNM, MSN Certified Nurse-Midwife 07/16/2023 11:43 PM

## 2023-07-16 NOTE — MAU Note (Incomplete)
.  Tammy [name] is a 32 y.o. at [redacted]w[redacted]d here in MAU reporting: tested positive for covid thsi morning - had fever and body aches. Went to use the bathroom - started feeling a swelling a cramping, went to lay down and had a gush of liquid come out that completely soaked her underwear. Still having discomfort and cramping in lower abdomen. Still continuing to leak - fluid is clear. Denies VB.   Onset of complaint: 2230 Pain score: 8 - cramps.  There were no vitals filed for this visit.   FHT:*** Lab orders placed from triage:

## 2023-07-17 ENCOUNTER — Inpatient Hospital Stay (HOSPITAL_COMMUNITY): Payer: Medicaid Other

## 2023-07-17 ENCOUNTER — Encounter (HOSPITAL_COMMUNITY): Payer: Self-pay | Admitting: Obstetrics and Gynecology

## 2023-07-17 DIAGNOSIS — Z87891 Personal history of nicotine dependence: Secondary | ICD-10-CM | POA: Diagnosis not present

## 2023-07-17 DIAGNOSIS — O4290 Premature rupture of membranes, unspecified as to length of time between rupture and onset of labor, unspecified weeks of gestation: Secondary | ICD-10-CM | POA: Diagnosis present

## 2023-07-17 DIAGNOSIS — O42912 Preterm premature rupture of membranes, unspecified as to length of time between rupture and onset of labor, second trimester: Secondary | ICD-10-CM | POA: Diagnosis not present

## 2023-07-17 DIAGNOSIS — D62 Acute posthemorrhagic anemia: Secondary | ICD-10-CM | POA: Diagnosis not present

## 2023-07-17 DIAGNOSIS — Z8616 Personal history of COVID-19: Secondary | ICD-10-CM | POA: Diagnosis not present

## 2023-07-17 DIAGNOSIS — Z3A16 16 weeks gestation of pregnancy: Secondary | ICD-10-CM | POA: Diagnosis not present

## 2023-07-17 DIAGNOSIS — W010XXA Fall on same level from slipping, tripping and stumbling without subsequent striking against object, initial encounter: Secondary | ICD-10-CM | POA: Diagnosis not present

## 2023-07-17 DIAGNOSIS — U071 COVID-19: Secondary | ICD-10-CM

## 2023-07-17 DIAGNOSIS — F32A Depression, unspecified: Secondary | ICD-10-CM | POA: Diagnosis present

## 2023-07-17 DIAGNOSIS — O9832 Other infections with a predominantly sexual mode of transmission complicating childbirth: Secondary | ICD-10-CM | POA: Diagnosis present

## 2023-07-17 DIAGNOSIS — O034 Incomplete spontaneous abortion without complication: Secondary | ICD-10-CM | POA: Diagnosis not present

## 2023-07-17 DIAGNOSIS — O1092 Unspecified pre-existing hypertension complicating childbirth: Secondary | ICD-10-CM | POA: Diagnosis present

## 2023-07-17 DIAGNOSIS — O9852 Other viral diseases complicating childbirth: Secondary | ICD-10-CM | POA: Diagnosis present

## 2023-07-17 DIAGNOSIS — O99214 Obesity complicating childbirth: Secondary | ICD-10-CM | POA: Diagnosis present

## 2023-07-17 DIAGNOSIS — O42919 Preterm premature rupture of membranes, unspecified as to length of time between rupture and onset of labor, unspecified trimester: Secondary | ICD-10-CM

## 2023-07-17 DIAGNOSIS — Y92231 Patient bathroom in hospital as the place of occurrence of the external cause: Secondary | ICD-10-CM | POA: Diagnosis not present

## 2023-07-17 DIAGNOSIS — O021 Missed abortion: Secondary | ICD-10-CM | POA: Diagnosis present

## 2023-07-17 DIAGNOSIS — F419 Anxiety disorder, unspecified: Secondary | ICD-10-CM | POA: Diagnosis present

## 2023-07-17 DIAGNOSIS — Y93F9 Activity, other caregiving: Secondary | ICD-10-CM | POA: Diagnosis not present

## 2023-07-17 DIAGNOSIS — O9081 Anemia of the puerperium: Secondary | ICD-10-CM | POA: Diagnosis not present

## 2023-07-17 DIAGNOSIS — O99344 Other mental disorders complicating childbirth: Secondary | ICD-10-CM | POA: Diagnosis present

## 2023-07-17 DIAGNOSIS — A6000 Herpesviral infection of urogenital system, unspecified: Secondary | ICD-10-CM | POA: Diagnosis present

## 2023-07-17 DIAGNOSIS — F909 Attention-deficit hyperactivity disorder, unspecified type: Secondary | ICD-10-CM | POA: Diagnosis present

## 2023-07-17 LAB — CBC
HCT: 35.5 % — ABNORMAL LOW (ref 36.0–46.0)
HCT: 34.6 % — ABNORMAL LOW (ref 36.0–46.0)
HCT: 28.9 % — ABNORMAL LOW (ref 36.0–46.0)
HCT: 25.3 % — ABNORMAL LOW (ref 36.0–46.0)
Hemoglobin: 11.5 g/dL — ABNORMAL LOW (ref 12.0–15.0)
Hemoglobin: 10.8 g/dL — ABNORMAL LOW (ref 12.0–15.0)
Hemoglobin: 9.1 g/dL — ABNORMAL LOW (ref 12.0–15.0)
Hemoglobin: 8.1 g/dL — ABNORMAL LOW (ref 12.0–15.0)
MCH: 27.6 pg (ref 26.0–34.0)
MCH: 26.2 pg (ref 26.0–34.0)
MCH: 26.5 pg (ref 26.0–34.0)
MCH: 26.7 pg (ref 26.0–34.0)
MCHC: 32.4 g/dL (ref 30.0–36.0)
MCHC: 31.2 g/dL (ref 30.0–36.0)
MCHC: 31.5 g/dL (ref 30.0–36.0)
MCHC: 32 g/dL (ref 30.0–36.0)
MCV: 85.1 fL (ref 80.0–100.0)
MCV: 83.8 fL (ref 80.0–100.0)
MCV: 84 fL (ref 80.0–100.0)
MCV: 83.5 fL (ref 80.0–100.0)
Platelets: 287 10*3/uL (ref 150–400)
Platelets: 302 10*3/uL (ref 150–400)
Platelets: 255 10*3/uL (ref 150–400)
Platelets: 235 10*3/uL (ref 150–400)
RBC: 4.17 MIL/uL (ref 3.87–5.11)
RBC: 4.13 MIL/uL (ref 3.87–5.11)
RBC: 3.44 MIL/uL — ABNORMAL LOW (ref 3.87–5.11)
RBC: 3.03 MIL/uL — ABNORMAL LOW (ref 3.87–5.11)
RDW: 13.1 % (ref 11.5–15.5)
RDW: 13.1 % (ref 11.5–15.5)
RDW: 13.2 % (ref 11.5–15.5)
RDW: 13.3 % (ref 11.5–15.5)
WBC: 5.9 10*3/uL (ref 4.0–10.5)
WBC: 8.2 10*3/uL (ref 4.0–10.5)
WBC: 7.8 10*3/uL (ref 4.0–10.5)
WBC: 6.1 10*3/uL (ref 4.0–10.5)
nRBC: 0 % (ref 0.0–0.2)
nRBC: 0 % (ref 0.0–0.2)
nRBC: 0 % (ref 0.0–0.2)
nRBC: 0 % (ref 0.0–0.2)

## 2023-07-17 LAB — TYPE AND SCREEN
ABO/RH(D): A POS
Antibody Screen: NEGATIVE

## 2023-07-17 LAB — COMPREHENSIVE METABOLIC PANEL
ALT: 78 U/L — ABNORMAL HIGH (ref 0–44)
AST: 42 U/L — ABNORMAL HIGH (ref 15–41)
Albumin: 3.3 g/dL — ABNORMAL LOW (ref 3.5–5.0)
Alkaline Phosphatase: 53 U/L (ref 38–126)
Anion gap: 7 (ref 5–15)
BUN: 8 mg/dL (ref 6–20)
CO2: 20 mmol/L — ABNORMAL LOW (ref 22–32)
Calcium: 8.7 mg/dL — ABNORMAL LOW (ref 8.9–10.3)
Chloride: 105 mmol/L (ref 98–111)
Creatinine, Ser: 0.9 mg/dL (ref 0.44–1.00)
GFR, Estimated: >60 mL/min (ref >60)
Glucose, Bld: 125 mg/dL — ABNORMAL HIGH (ref 70–99)
Potassium: 3.8 mmol/L (ref 3.5–5.1)
Sodium: 132 mmol/L — ABNORMAL LOW (ref 135–145)
Total Bilirubin: 0.3 mg/dL (ref 0.3–1.2)
Total Protein: 6.4 g/dL — ABNORMAL LOW (ref 6.5–8.1)

## 2023-07-17 LAB — DIC (DISSEMINATED INTRAVASCULAR COAGULATION)PANEL
D-Dimer, Quant: 1.67 ug/mL-FEU — ABNORMAL HIGH (ref 0.00–0.50)
Fibrinogen: 432 mg/dL (ref 210–475)
INR: 1 (ref 0.8–1.2)
Platelets: 308 10*3/uL (ref 150–400)
Prothrombin Time: 13.1 seconds (ref 11.4–15.2)
Smear Review: NONE SEEN
aPTT: 28 seconds (ref 24–36)

## 2023-07-17 MED ORDER — COCONUT OIL OIL: 1.0000 | TOPICAL_OIL | Status: DC | PRN

## 2023-07-17 MED ORDER — OXYCODONE-ACETAMINOPHEN 5-325 MG PO TABS: 1.0000 | ORAL_TABLET | Freq: Once | ORAL | Status: DC

## 2023-07-17 MED ORDER — LOPERAMIDE HCL 2 MG PO CAPS
4.0000 mg | ORAL_CAPSULE | ORAL | Status: DC | PRN
  Administered 2023-07-17: 4 mg via ORAL
  Filled 2023-07-17: qty 2

## 2023-07-17 MED ORDER — BUSPIRONE HCL 5 MG PO TABS
5.0000 mg | ORAL_TABLET | Freq: Two times a day (BID) | ORAL | Status: DC
  Administered 2023-07-17 – 2023-07-19 (×5): 5 mg via ORAL
  Filled 2023-07-17 (×6): qty 1

## 2023-07-17 MED ORDER — CARBOPROST TROMETHAMINE 250 MCG/ML IM SOLN
250.0000 ug | Freq: Once | INTRAMUSCULAR | Status: AC
  Administered 2023-07-17: 250 ug via INTRAMUSCULAR

## 2023-07-17 MED ORDER — LACTATED RINGERS IV SOLN
125.0000 mL/h | INTRAVENOUS | Status: AC
  Administered 2023-07-17: 125 mL/h via INTRAVENOUS

## 2023-07-17 MED ORDER — CARBOPROST TROMETHAMINE 250 MCG/ML IM SOLN
INTRAMUSCULAR | Status: AC
  Administered 2023-07-17: 250 ug
  Filled 2023-07-17: qty 1

## 2023-07-17 MED ORDER — BENZOCAINE-MENTHOL 20-0.5 % EX AERO: 1.0000 | INHALATION_SPRAY | CUTANEOUS | Status: DC | PRN

## 2023-07-17 MED ORDER — ACETAMINOPHEN 325 MG PO TABS
650.0000 mg | ORAL_TABLET | ORAL | Status: DC | PRN
  Filled 2023-07-17: qty 2

## 2023-07-17 MED ORDER — SIMETHICONE 80 MG PO CHEW: 80.0000 mg | CHEWABLE_TABLET | ORAL | Status: DC | PRN

## 2023-07-17 MED ORDER — MISOPROSTOL 200 MCG PO TABS
400.0000 ug | ORAL_TABLET | ORAL | Status: DC
  Administered 2023-07-17 (×2): 400 ug via ORAL
  Filled 2023-07-17 (×2): qty 2

## 2023-07-17 MED ORDER — OXYCODONE HCL 5 MG PO TABS
10.0000 mg | ORAL_TABLET | ORAL | Status: DC | PRN
  Administered 2023-07-18: 10 mg via ORAL
  Filled 2023-07-17: qty 2

## 2023-07-17 MED ORDER — IBUPROFEN 600 MG PO TABS
600.0000 mg | ORAL_TABLET | Freq: Four times a day (QID) | ORAL | Status: DC
  Administered 2023-07-17 – 2023-07-19 (×7): 600 mg via ORAL
  Filled 2023-07-17 (×8): qty 1

## 2023-07-17 MED ORDER — ACETAMINOPHEN 325 MG PO TABS
650.0000 mg | ORAL_TABLET | ORAL | Status: DC | PRN
  Administered 2023-07-17: 650 mg via ORAL
  Filled 2023-07-17: qty 2

## 2023-07-17 MED ORDER — PRENATAL MULTIVITAMIN CH: 1.0000 | ORAL_TABLET | Freq: Every day | ORAL | Status: DC

## 2023-07-17 MED ORDER — DIPHENHYDRAMINE HCL 25 MG PO CAPS: 25.0000 mg | ORAL_CAPSULE | Freq: Four times a day (QID) | ORAL | Status: DC | PRN

## 2023-07-17 MED ORDER — PRENATAL MULTIVITAMIN CH
1.0000 | ORAL_TABLET | Freq: Every day | ORAL | Status: DC
  Administered 2023-07-17 – 2023-07-18 (×2): 1 via ORAL
  Filled 2023-07-17 (×2): qty 1

## 2023-07-17 MED ORDER — OXYCODONE HCL 5 MG PO TABS
5.0000 mg | ORAL_TABLET | ORAL | Status: DC | PRN
  Administered 2023-07-18 – 2023-07-19 (×2): 5 mg via ORAL
  Filled 2023-07-17 (×2): qty 1

## 2023-07-17 MED ORDER — ONDANSETRON HCL 4 MG/2ML IJ SOLN: 4.0000 mg | INTRAMUSCULAR | Status: DC | PRN

## 2023-07-17 MED ORDER — CARBOPROST TROMETHAMINE 250 MCG/ML IM SOLN: 250.0000 ug | Freq: Once | INTRAMUSCULAR | Status: AC

## 2023-07-17 MED ORDER — LACTATED RINGERS IV SOLN: INTRAVENOUS | Status: DC

## 2023-07-17 MED ORDER — ZOLPIDEM TARTRATE 5 MG PO TABS: 5.0000 mg | ORAL_TABLET | Freq: Every evening | ORAL | Status: DC | PRN

## 2023-07-17 MED ORDER — DIBUCAINE (PERIANAL) 1 % EX OINT: 1.0000 | TOPICAL_OINTMENT | CUTANEOUS | Status: DC | PRN

## 2023-07-17 MED ORDER — LABETALOL HCL 200 MG PO TABS
200.0000 mg | ORAL_TABLET | Freq: Two times a day (BID) | ORAL | Status: DC
  Administered 2023-07-17: 200 mg via ORAL
  Filled 2023-07-17: qty 1

## 2023-07-17 MED ORDER — IRON SUCROSE 500 MG IVPB - SIMPLE MED
500.0000 mg | Freq: Once | INTRAVENOUS | Status: AC
  Administered 2023-07-17: 500 mg via INTRAVENOUS
  Filled 2023-07-17: qty 275

## 2023-07-17 MED ORDER — FENTANYL CITRATE (PF) 100 MCG/2ML IJ SOLN
50.0000 ug | INTRAMUSCULAR | Status: DC | PRN
  Administered 2023-07-17: 50 ug via INTRAVENOUS
  Administered 2023-07-17: 100 ug via INTRAVENOUS
  Filled 2023-07-17 (×2): qty 2

## 2023-07-17 MED ORDER — DOCUSATE SODIUM 100 MG PO CAPS: 100.0000 mg | ORAL_CAPSULE | Freq: Every day | ORAL | Status: DC

## 2023-07-17 MED ORDER — OXYCODONE HCL 5 MG PO TABS
5.0000 mg | ORAL_TABLET | Freq: Once | ORAL | Status: AC
  Administered 2023-07-17: 5 mg via ORAL
  Filled 2023-07-17: qty 1

## 2023-07-17 MED ORDER — CALCIUM CARBONATE ANTACID 500 MG PO CHEW: 2.0000 | CHEWABLE_TABLET | ORAL | Status: DC | PRN

## 2023-07-17 MED ORDER — ONDANSETRON HCL 4 MG PO TABS: 4.0000 mg | ORAL_TABLET | ORAL | Status: DC | PRN

## 2023-07-17 MED ORDER — WITCH HAZEL-GLYCERIN EX PADS: 1.0000 | MEDICATED_PAD | CUTANEOUS | Status: DC | PRN

## 2023-07-17 MED ORDER — CARBOPROST TROMETHAMINE 250 MCG/ML IM SOLN
INTRAMUSCULAR | Status: AC
  Filled 2023-07-17: qty 1

## 2023-07-17 MED ORDER — LACTATED RINGERS IV BOLUS
1000.0000 mL | Freq: Once | INTRAVENOUS | Status: AC
  Administered 2023-07-17: 1000 mL via INTRAVENOUS

## 2023-07-17 NOTE — Progress Notes (Signed)
Responded to request for visit as patient experienced loss of fetus. Patient was opened to visit. Patient stated this was third loss. Patient also has Covid and believes it is related to the loss as she has have it the ree times as well. In addition she has developed high blood pressure with she also attributes to COVID.  The patient is in her early 40's and moved to [place] from [place] 3 years ago. She is settling in. She is single and has a father, step mother and younger sister living in [place] as well. Patient reports that at the time of the first pregnancy loss she broke down and exploded. While she is calm right now, she believes that in a few days when it hits her she will have to deal with the loss.     Patient fell while in hosptial room going to the bathroom.  She doesn't remember falling, just waking up on the floor. She states she is having headaches as she hit her head hard.   Patient would like another visit tomorrow if she is not discharged.

## 2023-07-17 NOTE — Progress Notes (Addendum)
Progress Note:  Pt. returned to bed after sitting on the commode. Placenta delivered spontaneously and apparently intact. Bedside US performed, no retained products of conception visualized. Bleeding stable and VSS. Total QBL 1597 mL, since delivery of infant. Cytotec 400 mcg PR administered for prophylaxis. Stat CBC and repeat in 6 hours. Pt. Agreeable to blood transfusion if needed.   Patient desire Anora testing.  Dr. Reesa Chew

## 2023-07-17 NOTE — Progress Notes (Addendum)
Delivery of non-viable fetus at 0630. Dr. Connye Burkitt at bedside. Hemabate given x2 and Cytotec given rectally. Dr. Hester Mates at bedside and in department. Placenta delivered at 0734. VSS. Report given to oncoming nurse Kandice Hams, RN.

## 2023-07-17 NOTE — Progress Notes (Signed)
Patient found on floor beside bed by Kandice Hams, RN and Marquis Buggy, RN after using the pull cord in the bathroom. Patient stated that she fell in the bathroom and does not remember getting up and falling again. Pillow placed under patient's head and VS obtained. During the incident, patient was found with IV removed prior to nurse entry. MD notified and IV replaced. 1000 cc fluid bolus given. BP's cycled. Patient states that she thinks she hit her head and complains of right sided headache 3/10. MD aware, orders placed. Patient alert and oriented x3.

## 2023-07-17 NOTE — Plan of Care (Signed)
  Problem: Education: Goal: Knowledge of disease or condition will improve Outcome: Progressing Goal: Knowledge of the prescribed therapeutic regimen will improve Outcome: Progressing Goal: Individualized Educational Video(s) Outcome: Progressing   Problem: Clinical Measurements: Goal: Complications related to the disease process, condition or treatment will be avoided or minimized Outcome: Progressing   Problem: Education: Goal: Knowledge of General Education information will improve Description: Including pain rating scale, medication(s)/side effects and non-pharmacologic comfort measures Outcome: Progressing   Problem: Health Behavior/Discharge Planning: Goal: Ability to manage health-related needs will improve Outcome: Progressing   Problem: Clinical Measurements: Goal: Ability to maintain clinical measurements within normal limits will improve Outcome: Progressing Goal: Will remain free from infection Outcome: Progressing Goal: Diagnostic test results will improve Outcome: Progressing Goal: Respiratory complications will improve Outcome: Progressing Goal: Cardiovascular complication will be avoided Outcome: Progressing   Problem: Activity: Goal: Risk for activity intolerance will decrease Outcome: Progressing   Problem: Nutrition: Goal: Adequate nutrition will be maintained Outcome: Progressing   Problem: Coping: Goal: Level of anxiety will decrease Outcome: Progressing   Problem: Elimination: Goal: Will not experience complications related to bowel motility Outcome: Progressing Goal: Will not experience complications related to urinary retention Outcome: Progressing   Problem: Pain Managment: Goal: General experience of comfort will improve Outcome: Progressing   Problem: Safety: Goal: Ability to remain free from injury will improve Outcome: Progressing   Problem: Skin Integrity: Goal: Risk for impaired skin integrity will decrease Outcome:  Progressing   Problem: Education: Goal: Knowledge of condition will improve Outcome: Progressing Goal: Individualized Educational Video(s) Outcome: Progressing Goal: Individualized Newborn Educational Video(s) Outcome: Progressing   Problem: Activity: Goal: Will verbalize the importance of balancing activity with adequate rest periods Outcome: Progressing Goal: Ability to tolerate increased activity will improve Outcome: Progressing   Problem: Coping: Goal: Ability to identify and utilize available resources and services will improve Outcome: Progressing   Problem: Life Cycle: Goal: Chance of risk for complications during the postpartum period will decrease Outcome: Progressing   Problem: Role Relationship: Goal: Ability to demonstrate positive interaction with newborn will improve Outcome: Progressing   Problem: Skin Integrity: Goal: Demonstration of wound healing without infection will improve Outcome: Progressing   Problem: Education: Goal: Knowledge of disease or condition will improve Outcome: Progressing Goal: Knowledge of the prescribed therapeutic regimen will improve Outcome: Progressing   Problem: Coping: Goal: Ability to identify and utilize appropriate coping strategies will improve Outcome: Progressing Goal: Ability to identify and utilize available support systems will improve Outcome: Progressing Goal: Will verbalize feelings Outcome: Progressing Goal: Decrease level of anxiety will Outcome: Progressing   Problem: Clinical Measurements: Goal: Will show no signs and symptoms of excessive bleeding Outcome: Progressing

## 2023-07-17 NOTE — Progress Notes (Signed)
   07/17/23 1100  Spiritual Encounters  Type of Visit Initial  Care provided to: Patient  Conversation partners present during encounter Nurse  Referral source Nurse (RN/NT/LPN)  Reason for visit Grief/loss  OnCall Visit No   Ch responded to request for emotional and spiritual support. Patient was not ready to talk she request for Ch to come back later. Ch will follow-up later.

## 2023-07-17 NOTE — H&P (Signed)
HPI: 32 y.o. [id number] @ [redacted]w[redacted]d estimated gestational age (as dated by 12 week ultrasound) who is admitted for previable PPROM. Patient had a gush of fluid at approximately 2230 on 7/31. She called the answering service and was advised to be evaluated in the hospital. Reports clear fluid and cramping.    Of note, patient tested positive for COVID on 7/31. States she had fever and body aches and not feeling well due to COVID. Feels some pressure in her vagina. She does not have any current support people. Her significant other is a Naval architect and is not in town. She would like to call her stepmother later this morning.  Leakage of fluid:  Yes Vaginal bleeding:  No Contractions:  Cramping Fetal movement:  N/A  Prenatal care has been provided by [facility]).  ROS:  Denies fevers, chills, chest pain, visual changes, SOB, RUQ/epigastric pain, N/V, dysuria, hematuria, or sudden onset/worsening bilateral LE or facial edema.  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)  Prenatal Transfer Tool  Maternal Diabetes: No Genetic Screening: Reports normal genetic screening per patient Maternal Ultrasounds/Referrals: Other: Fetal Ultrasounds or other Referrals:  None Maternal Substance Abuse:  No Significant Maternal Medications:  None Significant Maternal Lab Results: None   Prenatal Labs (unable to review labs): Blood type:  Unknown Antibody screen:  Negative CBC:  H/H Pending Rubella: Unknown RPR:  Unknown Hep B:  Unknown Hep C:  Unknown HIV:  Unknown GC/CT:  Negative Glucola:  Not performed yet  OBHx:  OB History     Gravida  3   Para      Term      Preterm      AB  2   Living         SAB  2   IAB      Ectopic      Multiple      Live Births             PMHx:  See above Meds:   PNV Allergy:  No Known Allergies SurgHx:  Past Surgical History:  Procedure Laterality Date   SHOULDER SURGERY     SocHx:   Denies Tobacco, ETOH, illicit drugs  O: BP 121/71 (BP Location: Right Arm)   Pulse (!) 105   Temp 99.8 F (37.7 C) (Oral)   Resp 18   Ht 5\' 8"  (1.727 m)   Wt 111.5 kg   LMP 03/23/2023   SpO2 98%   BMI 37.37 kg/m  Gen. AAOx3, NAD CV.  Regular rate/rhythm Resp. Normal work of breathing Abd. Gravid, soft, non-tender throughout, no rebound/guarding Extr.  No bilateral LE edema, no calf tenderness bilaterally SSE:  dilated cervix with small parts visible.  Small amount of blood tinged watery fluid in vault, + ferning  (per Wynelle Bourgeois, CNM in [facility]U)  Ultrasound (formal read not performed yet): No cardiac activity visualized, portion of fetus in vaginal canal, breech    Labs: see orders  A/P:  32 y.o. [id number] @ [redacted]w[redacted]d who is admitted for previable PPROM/inevitable SAB.  - Ad[facility] to [facility] - Admit labs (CBC, T&S) - Diet:  Clear liquids - IVF:  SLIV - VTE Prophylaxis:  SCDs - Pain control:  Per patient request - Induction method: Cytotec orally q4h for up to 4 doses (suspect it may only take 1-2 doses) -  Patient desires to start induction as soon as possible even without her support persons - Will start induction process after IV is established by the IV team and once patient transfers to [facility] specialty care  Steva Ready, DO

## 2023-07-17 NOTE — Progress Notes (Signed)
Transported patient from floor to bed via 2 person assist. Orthostatic changes present (see assessment).

## 2023-07-17 NOTE — Progress Notes (Signed)
Progress Note: Called and notified by RN that patient had a fall while getting up after voiding. No one was present in the room when patient went to void. She pulled the emergency cord in the restroom and RN presented to bedside and patient was on floor. Per RN report she was alert and orient x3 and stated she hit her head. No visible trauma noted. BP cycled in pt. Noted to be hypotensive, now normotensive and had period of tachycardia now resolved and HR now RRR. Bleeding has remained minimal to none throughout fall episode. Of note patient did received her scheduled Labetalol 200mg  at 1022 for [facility] prior to fall episode. Labetalol discontinued, stat CBC ordered, head CT ordered, IVF bolus initiated. I saw patient at bedside and states she is feeling better, BP improved, no bleeding, no pain, VSS.  Dr. Reesa Chew

## 2023-07-17 NOTE — Progress Notes (Signed)
Patient reports feeling a "knot" on right side of head consistent with patient's report of hitting her head upon falling. RN unable to palpate. Patient alert and oriented x4. Pupils equal and reactive. Patient reports a right sided headache 2/10 that is relieved with ice packs. New complaints of intermittent lower back pain that patient states "feels like cramps." RN educated patient on reporting change in condition/pain. Relief reported with use of hot packs.

## 2023-07-17 NOTE — Progress Notes (Signed)
RN at bedside. Patient in bathroom; passed a quarter-sized piece of tissue with placental appearance. MD notified and at bedside. Blood loss minimal.

## 2023-07-17 NOTE — Progress Notes (Addendum)
   07/17/23 1200  What Happened  Was fall witnessed? No  Was patient injured? Unsure  Patient found on floor  Found by Staff-comment  Stated prior activity bathroom-unassisted  Provider Notification  Provider Name/Title Dr. Hester Mates  Date Provider Notified 07/17/23  Time Provider Notified 1131  Method of Notification Call  Notification Reason Fall  Provider response See new orders  Date of Provider Response 07/17/23  Time of Provider Response 1131  Follow Up  Family notified No - patient refusal  Additional tests Yes-comment (CT due to headache. Pt claims she hit her head)  Progress note created (see row info) Yes  Adult Fall Risk Interventions  Fall intervention(s) refused/Patient educated regarding refusal Nonskid socks;Supervision while toileting/edge of bed sitting  Screening for Fall Injury Risk (To be completed on HIGH fall risk patients) - Assessing Need for Floor Mats  Risk For Fall Injury- Criteria for Floor Mats Previous fall this admission  Vitals  Temp 97.9 F (36.6 C)  Temp Source Axillary  BP (!) 80/53  BP Location Left Arm  BP Method Automatic  Patient Position (if appropriate) Lying  Pulse Rate 98  Pulse Rate Source Monitor  Resp 16  Oxygen Therapy  SpO2 99 %  O2 Device Room Air  Pain Assessment  Pain Scale 0-10  Pain Score 4  Pain Type Acute pain  Pain Location Head  Pain Orientation Right;Anterior  Pain Descriptors / Indicators Headache  Pain Frequency Constant  Pain Onset Sudden  Patients Stated Pain Goal 2  Pain Intervention(s) MD notified (Comment);Repositioned;Hot/Cold interventions (CT)  Multiple Pain Sites No  Hot/Cold Interventions  Hot/Cold Interventions Ice Pack  Neurological  Neuro (WDL) WDL  Level of Consciousness Alert  NIH Stroke Scale   Level of Consciousness (1a.)    0  Musculoskeletal  Musculoskeletal (WDL) WDL  Integumentary  Integumentary (WDL) WDL  Pain Assessment  Date Pain First Started 07/17/23  Result of Injury  Yes  Pain Assessment  Work-Related Injury No   Patient status post-fall with continued focused assessments. Patient alert and oriented x4. High fall risk interventions implemented; patient educated on fall precautions, assistance with ADLs.

## 2023-07-17 NOTE — Progress Notes (Signed)
CSW received consult due to fetal loss at 16 weeks.  CSW available for support secondary to [facility] and will await call from Chaplain before becoming involved.  CSW screening out referral at this time.  CSW spoke with spiritual care rep and requested for pt to be seen.   Blaine Hamper, MSW, LCSW Clinical Social Work [phone]

## 2023-07-18 LAB — CBC
HCT: 24.1 % — ABNORMAL LOW (ref 36.0–46.0)
Hemoglobin: 7.6 g/dL — ABNORMAL LOW (ref 12.0–15.0)
MCH: 26.7 pg (ref 26.0–34.0)
MCHC: 31.5 g/dL (ref 30.0–36.0)
MCV: 84.6 fL (ref 80.0–100.0)
Platelets: 230 10*3/uL (ref 150–400)
RBC: 2.85 MIL/uL — ABNORMAL LOW (ref 3.87–5.11)
RDW: 13.4 % (ref 11.5–15.5)
WBC: 5 10*3/uL (ref 4.0–10.5)
nRBC: 0 % (ref 0.0–0.2)

## 2023-07-18 LAB — PREPARE RBC (CROSSMATCH)

## 2023-07-18 MED ORDER — SODIUM CHLORIDE 0.9% IV SOLUTION: Freq: Once | INTRAVENOUS | Status: DC

## 2023-07-18 NOTE — Progress Notes (Signed)
   07/18/23 1237  Spiritual Encounters  Type of Visit Follow up  Care provided to: Pt not available  OnCall Visit No   Went to patient's room with the intention of doing follow-up care, however patient was with the medical team and therefore not available. Left to fulfill another consult. Waved from glass door to let patient know that I will return, before leaving.   Returned to patient's room. Patient still with medical team. Chaplain leaving for the day, however follow up consult being referred to the on coming chaplain.

## 2023-07-18 NOTE — Progress Notes (Signed)
PPD# 1 IUFD @ 16+5 wks  S:   Reports feeling very sleepy. Mild HA since fall, but HA is relieved with OTC meds. Desires to rest without interruption. Grief appears to be appropriate for the situation. Discussed discharge and pt agrees. Tolerating PO fluid and solids No nausea or vomiting Bleeding is light Pain controlled with acetaminophen and ibuprofen (OTC)  Covid symptoms have improved. Discussed quarantine, safe distancing, and worsening of symptoms to report.    O:   VS: BP (!) 105/53 (BP Location: Left Arm)   Pulse 78   Temp 98 F (36.7 C) (Oral)   Resp 17   Ht 5\' 8"  (1.727 m)   Wt 111.5 kg   LMP 03/23/2023   SpO2 98%   BMI 37.37 kg/m   LABS:  Recent Labs    07/17/23 2007 07/18/23 0803  WBC 6.1 5.0  HGB 8.1* 7.6*  PLT 235 230   Blood type: --/--/A POS (08/01 0127) Rubella:                        I&O: Intake/Output      08/01 0701 08/02 0700 08/02 0701 08/03 0700   I.V. (mL/kg) 305.4 (2.7)    IV Piggyback 18.4    Total Intake(mL/kg) 323.8 (2.9)    Urine (mL/kg/hr) 1100 (0.4)    Blood 662    Total Output 1762    Net -1438.2         Urine Occurrence 1 x      Physical Exam: Alert and oriented X3 Lungs: Clear and unlabored Heart: regular rate and rhythm / no mumurs Abdomen: soft, non-tender, non-distended  Fundus: firm, barely palpable Perineum: intact, non-edematous Lochia: light, no clots Extremities: no edema, no calf pain, tenderness, or cords    A:  PPD # 1 IUFD Anemia - acute blood loss     -S/P Venofer infusion CHTN    -Labetaolo 200 mg PO daily with hold parameters S/P fall     -stable, no evidence of sequelae  P:  Continue routine postpartum orders F/U in 2 weeks for mood eval, BP, and CBC w/ [facility]  Plan reviewed w/ Dr. Evorn Gong, DNP, CNM 07/18/2023, 5:30 PM

## 2023-07-18 NOTE — Progress Notes (Signed)
Pt ambulated to BR independently w/ RN present. Pt remains fatigued. Offered PRBC x1 unit for symptomatic anemia. Pt agrees to transfusion with repeat CBC in AM. Discharge on hold until after CBC results. Plan discussed with Dr. Su Hilt.

## 2023-07-19 ENCOUNTER — Encounter (HOSPITAL_COMMUNITY): Payer: Self-pay | Admitting: *Deleted

## 2023-07-19 DIAGNOSIS — D62 Acute posthemorrhagic anemia: Secondary | ICD-10-CM | POA: Diagnosis not present

## 2023-07-19 DIAGNOSIS — U071 COVID-19: Secondary | ICD-10-CM | POA: Diagnosis present

## 2023-07-19 LAB — CBC
HCT: 26.8 % — ABNORMAL LOW (ref 36.0–46.0)
Hemoglobin: 8.7 g/dL — ABNORMAL LOW (ref 12.0–15.0)
MCH: 27.3 pg (ref 26.0–34.0)
MCHC: 32.5 g/dL (ref 30.0–36.0)
MCV: 84 fL (ref 80.0–100.0)
Platelets: 243 10*3/uL (ref 150–400)
RBC: 3.19 MIL/uL — ABNORMAL LOW (ref 3.87–5.11)
RDW: 13.4 % (ref 11.5–15.5)
WBC: 8.9 10*3/uL (ref 4.0–10.5)
nRBC: 0 % (ref 0.0–0.2)

## 2023-07-19 LAB — ANTIPHOSPHOLIPID SYNDROME EVAL, BLD
Anticardiolipin IgA: <9 [APL'U]/mL (ref 0–11)
Anticardiolipin IgG: <9 [GPL'U]/mL (ref 0–14)
Anticardiolipin IgM: <9 [MPL'U]/mL (ref 0–12)
DRVVT: 27.9 s (ref 0.0–47.0)
PTT Lupus Anticoagulant: 35.8 s (ref 0.0–43.5)
Phosphatydalserine, IgA: 1 {APS'U} (ref 0–19)
Phosphatydalserine, IgG: 9 U (ref 0–30)
Phosphatydalserine, IgM: 14 U (ref 0–30)

## 2023-07-19 MED ORDER — IBUPROFEN 600 MG PO TABS: 600.0000 mg | ORAL_TABLET | Freq: Four times a day (QID) | ORAL | 0 refills | Status: DC

## 2023-07-19 MED ORDER — ACETAMINOPHEN 325 MG PO TABS: 650.0000 mg | ORAL_TABLET | ORAL | Status: DC | PRN

## 2023-07-19 NOTE — Plan of Care (Signed)
  Problem: Education: Goal: Knowledge of disease or condition will improve Outcome: Completed/Met Goal: Knowledge of the prescribed therapeutic regimen will improve Outcome: Completed/Met Goal: Individualized Educational Video(s) Outcome: Completed/Met   Problem: Clinical Measurements: Goal: Complications related to the disease process, condition or treatment will be avoided or minimized Outcome: Completed/Met   Problem: Education: Goal: Knowledge of General Education information will improve Description: Including pain rating scale, medication(s)/side effects and non-pharmacologic comfort measures Outcome: Completed/Met   Problem: Health Behavior/Discharge Planning: Goal: Ability to manage health-related needs will improve Outcome: Completed/Met   Problem: Clinical Measurements: Goal: Ability to maintain clinical measurements within normal limits will improve Outcome: Completed/Met Goal: Will remain free from infection Outcome: Completed/Met Goal: Diagnostic test results will improve Outcome: Completed/Met Goal: Respiratory complications will improve Outcome: Completed/Met Goal: Cardiovascular complication will be avoided Outcome: Completed/Met   Problem: Activity: Goal: Risk for activity intolerance will decrease Outcome: Completed/Met   Problem: Nutrition: Goal: Adequate nutrition will be maintained Outcome: Completed/Met   Problem: Coping: Goal: Level of anxiety will decrease Outcome: Completed/Met   Problem: Elimination: Goal: Will not experience complications related to bowel motility Outcome: Completed/Met Goal: Will not experience complications related to urinary retention Outcome: Completed/Met   Problem: Pain Managment: Goal: General experience of comfort will improve Outcome: Completed/Met   Problem: Safety: Goal: Ability to remain free from injury will improve Outcome: Completed/Met   Problem: Skin Integrity: Goal: Risk for impaired skin  integrity will decrease Outcome: Completed/Met   Problem: Education: Goal: Knowledge of condition will improve Outcome: Completed/Met Goal: Individualized Educational Video(s) Outcome: Completed/Met Goal: Individualized Newborn Educational Video(s) Outcome: Completed/Met   Problem: Activity: Goal: Will verbalize the importance of balancing activity with adequate rest periods Outcome: Completed/Met Goal: Ability to tolerate increased activity will improve Outcome: Completed/Met   Problem: Coping: Goal: Ability to identify and utilize available resources and services will improve Outcome: Completed/Met   Problem: Life Cycle: Goal: Chance of risk for complications during the postpartum period will decrease Outcome: Completed/Met   Problem: Role Relationship: Goal: Ability to demonstrate positive interaction with newborn will improve Outcome: Completed/Met   Problem: Skin Integrity: Goal: Demonstration of wound healing without infection will improve Outcome: Completed/Met   Problem: Education: Goal: Knowledge of disease or condition will improve Outcome: Completed/Met Goal: Knowledge of the prescribed therapeutic regimen will improve Outcome: Completed/Met   Problem: Coping: Goal: Ability to identify and utilize appropriate coping strategies will improve Outcome: Completed/Met Goal: Ability to identify and utilize available support systems will improve Outcome: Completed/Met Goal: Will verbalize feelings Outcome: Completed/Met Goal: Decrease level of anxiety will Outcome: Completed/Met   Problem: Clinical Measurements: Goal: Will show no signs and symptoms of excessive bleeding Outcome: Completed/Met

## 2023-07-19 NOTE — Discharge Summary (Signed)
Postpartum Discharge Summary  Date of Service updated 07/19/23    Patient Name: Tammy [name] DOB: Aug 23, 1991 MRN: [id number]  Date of admission: 07/16/2023 Delivery date:This patient has no babies on file. Delivering provider: This patient has no babies on file. Date of discharge: 07/19/2023  Admitting diagnosis: Premature rupture of membranes [O42.90] IUFD at less than 20 weeks of gestation [O02.1] Intrauterine pregnancy: [redacted]w[redacted]d     Secondary diagnosis:  Principal Problem:   Premature rupture of membranes Active Problems:   Chronic hypertension affecting pregnancy   IUFD at less than 20 weeks of gestation   Lab test positive for detection of COVID-19 virus   Acute blood loss anemia (ABLA)  Additional problems: none   Discharge diagnosis:  Same as above                                               Post partum procedures:blood transfusion and iron infusion Augmentation: N/A Complications: Hemorrhage>1018mL  Hospital course: Onset of Labor With Vaginal Delivery      32 y.o. yo G3P0020 at [redacted]w[redacted]d was seen in maternity assessment unit for new onset covid, pelvic pain, and vaginal discharge after a gush of fluid. Preterm rupture of membranes diagnosed and pt admitted to [facility] on 07/16/2023 for anticipatory management of miscarriage. Labor course was complicated by cord avulsion due to fragility of the cord and a total blood loss of 1597 mL.   Delivery Method: spontaneous vaginal birth Episiotomy: No Lacerations:  None Patient had a postpartum course complicated by acute blood loss anemia and a fall. The fall occurred after she received an anti-hypertensive. Pt struck her head during the fall. A CT scan was performed and the imaging was benign. She reported dizziness when attempting to ambulate independently after being cleared via CT. These symptoms were associated with the new onset anemia secondary to the postpartum hemorrhage. Tammy [name] was given an iron infusion and  one unit of packed red blood cells for symptomatic anemia. After interventions, she is ambulating, tolerating a regular diet, passing flatus, and urinating well. Patient is discharged home in stable condition on 07/19/23.  Magnesium Sulfate received: No BMZ received: No Rhophylac:N/A MMR:N/A Transfusion:Yes  Physical exam  Vitals:   07/18/23 1806 07/18/23 1841 07/18/23 2013 07/19/23 0518  BP: 111/72 125/82 121/68 (!) 110/58  Pulse: 86 79 79 88  Resp: 17  18 16   Temp: 98.2 F (36.8 C)  98 F (36.7 C) 98.6 F (37 C)  TempSrc: Oral  Oral Oral  SpO2: 100%  100% 100%  Weight:      Height:       General: alert, cooperative, and no distress Lochia: appropriate Uterine Fundus: firm Incision: N/A DVT Evaluation: No evidence of DVT seen on physical exam. No cords or calf tenderness. No significant calf/ankle edema. Labs: Lab Results  Component Value Date   WBC 8.9 07/19/2023   HGB 8.7 (L) 07/19/2023   HCT 26.8 (L) 07/19/2023   MCV 84.0 07/19/2023   PLT 243 07/19/2023      Latest Ref Rng & Units 07/17/2023    9:02 AM  CMP  Glucose 70 - 99 mg/dL 629   BUN 6 - 20 mg/dL 8   Creatinine 5.28 - 4.13 mg/dL 2.44   Sodium 010 - 272 mmol/L 132   Potassium 3.5 - 5.1 mmol/L 3.8   Chloride  98 - 111 mmol/L 105   CO2 22 - 32 mmol/L 20   Calcium 8.9 - 10.3 mg/dL 8.7   Total Protein 6.5 - 8.1 g/dL 6.4   Total Bilirubin 0.3 - 1.2 mg/dL 0.3   Alkaline Phos 38 - 126 U/L 53   AST 15 - 41 U/L 42   ALT 0 - 44 U/L 78    Edinburgh Score:     No data to display            After visit meds:  Allergies as of 07/19/2023   No Known Allergies      Medication List     TAKE these medications    acetaminophen 325 MG tablet Commonly known as: Tylenol Take 2 tablets (650 mg total) by mouth every 4 (four) hours as needed (for pain scale < 4).   Adderall XR 25 MG 24 hr capsule Generic drug: amphetamine-dextroamphetamine Take 1 tablet by mouth daily.   buPROPion 150 MG 24 hr  tablet Commonly known as: WELLBUTRIN XL Take 1 tablet by mouth daily.   ibuprofen 600 MG tablet Commonly known as: ADVIL Take 1 tablet (600 mg total) by mouth every 6 (six) hours.   ondansetron 4 MG disintegrating tablet Commonly known as: Zofran ODT Take 1 tablet (4 mg total) by mouth every 8 (eight) hours as needed for nausea or vomiting.   prenatal multivitamin Tabs tablet Take 1 tablet by mouth daily at 12 noon.         Discharge home in stable condition Discharge instruction: per After Visit Summary and Postpartum booklet. Activity: Advance as tolerated. Pelvic rest for 6 weeks.  Diet: routine diet Anticipated Birth Control:  declines contraception Postpartum Appointment:2 weeks Additional Postpartum F/U: BP check 2-3 days and   Future Appointments: Future Appointments  Date Time Provider Department Center  08/08/2023 10:15 AM [facility] NURSE [facility]  08/08/2023 10:30 AM [facility]   Follow up Visit:  Follow-up Information     [facility] Follow up.   Specialty: Obstetrics and Gynecology Contact information: [address] [place] [address] [phone]                    07/19/2023 Roma Schanz, CNM

## 2023-07-19 NOTE — Progress Notes (Signed)
Anora kit was collected and sent on 07/17/23.

## 2023-07-21 LAB — SURGICAL PATHOLOGY

## 2023-07-23 LAB — ANORA MISCARRIAGE TEST - FRESH

## 2023-08-08 ENCOUNTER — Other Ambulatory Visit: Payer: Medicaid Other

## 2023-08-08 ENCOUNTER — Ambulatory Visit: Payer: Medicaid Other

## 2023-12-12 ENCOUNTER — Encounter (HOSPITAL_COMMUNITY): Payer: Self-pay | Admitting: *Deleted

## 2023-12-12 ENCOUNTER — Inpatient Hospital Stay (HOSPITAL_COMMUNITY)
Admission: AD | Admit: 2023-12-12 | Discharge: 2023-12-13 | Disposition: A | Discharge status: Home / Self Care | Payer: Medicaid Other | Attending: Obstetrics & Gynecology | Admitting: Obstetrics & Gynecology

## 2023-12-12 DIAGNOSIS — B3731 Acute candidiasis of vulva and vagina: Secondary | ICD-10-CM

## 2023-12-12 DIAGNOSIS — R1030 Lower abdominal pain, unspecified: Secondary | ICD-10-CM | POA: Diagnosis present

## 2023-12-12 DIAGNOSIS — Z3A09 9 weeks gestation of pregnancy: Secondary | ICD-10-CM

## 2023-12-12 DIAGNOSIS — M545 Low back pain, unspecified: Secondary | ICD-10-CM | POA: Diagnosis present

## 2023-12-12 DIAGNOSIS — N898 Other specified noninflammatory disorders of vagina: Secondary | ICD-10-CM | POA: Diagnosis present

## 2023-12-12 DIAGNOSIS — O98811 Other maternal infectious and parasitic diseases complicating pregnancy, first trimester: Secondary | ICD-10-CM | POA: Diagnosis not present

## 2023-12-12 LAB — URINALYSIS, ROUTINE W REFLEX MICROSCOPIC
Bacteria, UA: NONE SEEN
Bilirubin Urine: NEGATIVE
Glucose, UA: NEGATIVE mg/dL
Hgb urine dipstick: NEGATIVE
Ketones, ur: NEGATIVE mg/dL
Leukocytes,Ua: NEGATIVE
Nitrite: NEGATIVE
Protein, ur: NEGATIVE mg/dL
Specific Gravity, Urine: 1.027 (ref 1.005–1.030)
pH: 5 (ref 5.0–8.0)

## 2023-12-12 LAB — WET PREP, GENITAL
Clue Cells Wet Prep HPF POC: NONE SEEN
Sperm: NONE SEEN
Trich, Wet Prep: NONE SEEN
WBC, Wet Prep HPF POC: >=10 — AB (ref <10)

## 2023-12-12 LAB — POCT PREGNANCY, URINE: Preg Test, Ur: POSITIVE — AB

## 2023-12-12 NOTE — MAU Note (Addendum)
Pt says HPT- 2-3 weeks ago- positive  Has thick mucus vag D/C - no Odor- at 4pm Woke at 8pm- felt abd pressure and more vag D/C out -she called nurse line- told to come in.  Has burning and itching vag.  Plans to go to [facility] for Monday  Pain- in lower back- 3/10 Lower abd pain - 3/10- no meds -  pain started when she arrived here.

## 2023-12-13 ENCOUNTER — Encounter: Payer: Self-pay | Admitting: Advanced Practice Midwife

## 2023-12-13 DIAGNOSIS — Z3A09 9 weeks gestation of pregnancy: Secondary | ICD-10-CM

## 2023-12-13 DIAGNOSIS — B3731 Acute candidiasis of vulva and vagina: Secondary | ICD-10-CM

## 2023-12-13 MED ORDER — TERCONAZOLE 0.4 % VA CREA: 1.0000 | TOPICAL_CREAM | Freq: Every day | VAGINAL | 0 refills | Status: DC

## 2023-12-15 LAB — GC/CHLAMYDIA PROBE AMP (~~LOC~~) NOT AT ARMC
Chlamydia: NEGATIVE
Comment: NEGATIVE
Comment: NORMAL
Neisseria Gonorrhea: NEGATIVE

## 2023-12-17 NOTE — L&D Delivery Note (Addendum)
 Delivered footling breech [redacted]w[redacted]d fetus at 1314. Cord clamped and fetus placed on mother's chest per request. Signs of life present: + fetal heart rate 110, + fetal movement. Dr. Su Hilt notified and en route. No active bleeding; placenta undelivered at this time.  Upon my arrival, placenta still in situ and not ready to come out after pushing a couple of times.  No bleeding noted.  placed vaginally.  Placenta still in situ after 4hrs and cord now avulsed.  I reviewed option and pt wants to proceed with D&C possibly with suction.  Risks benefits alternatives reviewed including but not limited to bleeding infection and injury.  Questions answered and consent signed and witnessed.

## 2023-12-18 ENCOUNTER — Telehealth: Payer: Self-pay

## 2023-12-19 ENCOUNTER — Other Ambulatory Visit: Payer: Self-pay

## 2023-12-23 ENCOUNTER — Encounter: Payer: Medicaid Other | Attending: Obstetrics and Gynecology | Admitting: Dietician

## 2023-12-23 ENCOUNTER — Encounter: Payer: Self-pay | Admitting: Dietician

## 2023-12-23 DIAGNOSIS — Z713 Dietary counseling and surveillance: Secondary | ICD-10-CM | POA: Insufficient documentation

## 2023-12-23 DIAGNOSIS — Z3A11 11 weeks gestation of pregnancy: Secondary | ICD-10-CM | POA: Insufficient documentation

## 2023-12-23 DIAGNOSIS — O24119 Pre-existing diabetes mellitus, type 2, in pregnancy, unspecified trimester: Secondary | ICD-10-CM | POA: Diagnosis present

## 2023-12-23 DIAGNOSIS — O24111 Pre-existing diabetes mellitus, type 2, in pregnancy, first trimester: Secondary | ICD-10-CM | POA: Insufficient documentation

## 2023-12-23 NOTE — Progress Notes (Signed)
 Patient was seen for pre-existing Diabetes in pregnancy self-management on 12/23/23  Start time 1615 and End time 1725   Estimated due date: 07/13/24; [redacted]w[redacted]d  Clinical: Medications: reviewed Medical History: type 2 diabetes, HTN Labs: A1c 6.7% on 12/15/23  Dietary and Lifestyle History:  Pt reports with her previous delivery she delivered early at 13 weeks. Pt states this has caused her some concern in this pregnancy and reports she has not been exercising because she put herself on bedrest as she is worried about early delivery.   Pt reports overall low to moderate stress and states she feels she is good at dealing with stress and removing herself from stressful situations.   Pt states in November she started working at [employer] and was drinking a lot of sweet drinks during the day, along with eating sugar plum danish daily, and feels this caused her blood glucose to rise. Pt states she was also eating pancakes and syrups, and drinking coke.   Pt states she notices when she has too much sodium she can feel a difference in her blood pressure.   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   Pt considering starting pregnancy pilates at home.   Physical Activity: ADLs Stress: moderate stress Sleep: sleeps 4-5 hours, wakes up, sleeps again for 2-3 hours, sometimes takes melatonin.   24 hr Recall:  First Meal: 9am: doordash mcdonalds hotcakes and syrup or sausage egg and cheddar biscuit OR cinnamon raisin toast OR starbucks spinach feta wrap and matcha latte with 3 sugar packs Snack: Second meal: chipotle burrito bowl (eats half and may have other half for dinner) Snack: Third meal: 6pm: [employer] grilled cheese and tomato soup  Snack: dinner leftovers Beverages: matcha latte, 40 oz water   NUTRITION INTERVENTION  Nutrition education (E-1) on the following topics:   Initial Follow-up  [x]  []  Definition of Diabetes [x]  []  Why dietary management is important in controlling blood  glucose [x]  []  Effects each nutrient has on blood glucose levels [x]  []  Simple carbohydrates vs complex carbohydrates [x]  []  Fluid intake [x]  []  Creating a balanced meal plan [x]  []  Carbohydrate counting  [x]  []  When to check blood glucose levels [x]  []  Proper blood glucose monitoring techniques [x]  []  Effect of stress and stress reduction techniques  [x]  []  Exercise effect on blood glucose levels, appropriate exercise during pregnancy [x]  []  Importance of limiting caffeine  and abstaining from alcohol and smoking [x]  []  Medications used for blood sugar control during pregnancy [x]  []  Hypoglycemia and rule of 15 [x]  []  Postpartum self care  Blood glucose monitor given: Accu-Chek Guide Me Lot # [id number] Exp: 08/20/24 CBG: 122 mg/dL  Patient plans on testing pre breakfast and 2 hours after each meal. Postprandial: 122mg /dL (1 hour)  Patient instructed to monitor glucose levels: FBS: 60 - <= 95 mg/dL; 2 hour: <= 879 mg/dL  Patient received handouts: Nutrition Diabetes and Pregnancy Carbohydrate Counting List Blood glucose log Snack ideas for diabetes during pregnancy  Goals Established by Patient:  Goal: drink 60 oz water  daily (1 1/2 of your 40 oz bottles).  Goal: do pilates/exercise/walking 4-5 days per week for 15-30 minutes.   Other Tips:   Aim to eat within 1-2 hours of waking up and every 3-5 hours following.   When snacking, aim to include a complex carb and protein. (See snack example sheet)  At meals, aim to include 1/2 plate non-starchy vegetables, 1/4 plate protein, and 1/4 plate complex carbs.   Patient will be seen for follow-up as  needed.

## 2023-12-23 NOTE — Patient Instructions (Signed)
 Goal: drink 60 oz water  daily (1 1/2 of your 40 oz bottles).  Goal: do pilates/exercise/walking 4-5 days per week for 15-30 minutes.   Other Tips:   Aim to eat within 1-2 hours of waking up and every 3-5 hours following.   When snacking, aim to include a complex carb and protein. (See snack example sheet)  At meals, aim to include 1/2 plate non-starchy vegetables, 1/4 plate protein, and 1/4 plate complex carbs.

## 2023-12-25 DIAGNOSIS — E119 Type 2 diabetes mellitus without complications: Secondary | ICD-10-CM | POA: Insufficient documentation

## 2023-12-25 DIAGNOSIS — N96 Recurrent pregnancy loss: Secondary | ICD-10-CM | POA: Insufficient documentation

## 2023-12-25 DIAGNOSIS — O99210 Obesity complicating pregnancy, unspecified trimester: Secondary | ICD-10-CM | POA: Insufficient documentation

## 2023-12-25 DIAGNOSIS — O24919 Unspecified diabetes mellitus in pregnancy, unspecified trimester: Secondary | ICD-10-CM | POA: Insufficient documentation

## 2023-12-31 ENCOUNTER — Other Ambulatory Visit: Payer: Self-pay | Admitting: Obstetrics and Gynecology

## 2023-12-31 DIAGNOSIS — O2620 Pregnancy care for patient with recurrent pregnancy loss, unspecified trimester: Secondary | ICD-10-CM

## 2024-01-01 ENCOUNTER — Ambulatory Visit: Payer: Medicaid Other

## 2024-01-01 ENCOUNTER — Other Ambulatory Visit: Payer: Self-pay | Admitting: *Deleted

## 2024-01-01 ENCOUNTER — Ambulatory Visit: Payer: Medicaid Other | Attending: Obstetrics and Gynecology

## 2024-01-01 ENCOUNTER — Ambulatory Visit (HOSPITAL_BASED_OUTPATIENT_CLINIC_OR_DEPARTMENT_OTHER): Payer: Medicaid Other | Admitting: Obstetrics

## 2024-01-01 ENCOUNTER — Other Ambulatory Visit: Payer: Self-pay

## 2024-01-01 VITALS — BP 139/89 | HR 80

## 2024-01-01 DIAGNOSIS — O2620 Pregnancy care for patient with recurrent pregnancy loss, unspecified trimester: Secondary | ICD-10-CM | POA: Insufficient documentation

## 2024-01-01 DIAGNOSIS — O99211 Obesity complicating pregnancy, first trimester: Secondary | ICD-10-CM | POA: Diagnosis not present

## 2024-01-01 DIAGNOSIS — O24111 Pre-existing diabetes mellitus, type 2, in pregnancy, first trimester: Secondary | ICD-10-CM | POA: Diagnosis not present

## 2024-01-01 DIAGNOSIS — O09291 Supervision of pregnancy with other poor reproductive or obstetric history, first trimester: Secondary | ICD-10-CM | POA: Insufficient documentation

## 2024-01-01 DIAGNOSIS — O99210 Obesity complicating pregnancy, unspecified trimester: Secondary | ICD-10-CM

## 2024-01-01 DIAGNOSIS — E669 Obesity, unspecified: Secondary | ICD-10-CM

## 2024-01-01 DIAGNOSIS — O24319 Unspecified pre-existing diabetes mellitus in pregnancy, unspecified trimester: Secondary | ICD-10-CM | POA: Diagnosis present

## 2024-01-01 DIAGNOSIS — E119 Type 2 diabetes mellitus without complications: Secondary | ICD-10-CM

## 2024-01-01 DIAGNOSIS — O09299 Supervision of pregnancy with other poor reproductive or obstetric history, unspecified trimester: Secondary | ICD-10-CM

## 2024-01-01 DIAGNOSIS — Z7984 Long term (current) use of oral hypoglycemic drugs: Secondary | ICD-10-CM

## 2024-01-01 DIAGNOSIS — O10911 Unspecified pre-existing hypertension complicating pregnancy, first trimester: Secondary | ICD-10-CM | POA: Diagnosis present

## 2024-01-01 DIAGNOSIS — Z3A12 12 weeks gestation of pregnancy: Secondary | ICD-10-CM | POA: Insufficient documentation

## 2024-01-01 DIAGNOSIS — N96 Recurrent pregnancy loss: Secondary | ICD-10-CM

## 2024-01-01 DIAGNOSIS — O10011 Pre-existing essential hypertension complicating pregnancy, first trimester: Secondary | ICD-10-CM

## 2024-01-01 NOTE — Progress Notes (Signed)
MFM Consult Note  Tammy [name] is currently at 12 weeks and 2 days.  She was seen due to prior pregnancy loss at 16+ weeks in August 2024.    The patient reports that at the time of the loss, she had COVID and was experiencing frequent diarrhea.  She reports that she felt a lot of abdominal pressure and then ruptured membranes after she went to the bathroom.    When she presented to the MAU, her cervix was noted to be dilated with fetal parts in the vagina.    Her past pregnancy history includes 3 first trimester miscarriages and an abortion when she was 33 years old.    She denies any prior surgeries to her cervix.  The patient reports that she had a cell free DNA test drawn earlier in her pregnancy which indicated a low risk for trisomy 11, 42, and 13.  A female fetus is predicted.  Her pregnancy has also been complicated by pregestational diabetes that is treated with metformin and chronic hypertension treated with labetalol.  Her most recent hemoglobin A1c level was 6.7%.  The crown-rump length measured today is consistent with her gestational age, giving her an [facility] of July 13, 2024.  The nuchal translucency measured 1.09 mm, which is within normal limits.  The following were discussed during today's consultation:  Prior pregnancy loss at 16+ weeks following PPROM  The patient was advised that based on her history, it is uncertain if her prior pregnancy loss was due to cervical incompetence.  She was advised that in her situation, either a prophylactic cervical cerclage to be placed at 13 to 14 weeks or serial monitoring of cervical length with the placement of a cerclage should a shortened cervix be noted, may be pursued.  After the risks versus benefits of a cerclage placement versus monitoring of cervical length were discussed, the patient decided that she would prefer to be followed with serial cervical length measurements.    As her prior loss occurred at 16 weeks and 3 days,  I have scheduled her to come to our office at 15+ weeks (1 week before her prior loss) for a transvaginal cervical length measurement.  Should her cervical length at that time be within normal limits, we will continue to follow her with cervical length measurements every 2 weeks.    Should progressive cervical shortening be noted during her future exams, a cervical cerclage may be placed at that time.    The patient understands that a cervical cerclage does not always guarantee a successful pregnancy outcome.  Pregestational diabetes treated with metformin  The patient should continue taking metformin for management of her diabetes during pregnancy.    The goals for her fingerstick values are fasting values of 90-95 or less and two-hour postprandials of 120 or less.    Should the majority of her fingerstick values be above these values, her metformin dose may need to be increased or she may need to be started on insulin to achieve better glycemic control.  Due to pregestational diabetes, we will refer her to pediatric cardiology for a fetal echocardiogram following her fetal anatomy scan at 19 weeks.  A detailed fetal anatomy scan will be scheduled in our office at around 19 weeks.    Chronic hypertension treated with labetalol  The patient was advised to continue taking labetalol for treatment of her elevated blood pressures throughout her pregnancy.    The goals for her blood pressure control are blood pressures of 140/90  or less.  Due to chronic hypertension and pregestational diabetes, we will continue to follow her with growth ultrasounds throughout her pregnancy.    Weekly fetal testing should be started at around 32 weeks.  To decrease her risk of superimposed preeclampsia, she should start taking a daily baby aspirin (81 mg) for preeclampsia prophylaxis.  The patient will return to our office at 15+ weeks for a transvaginal cervical length measurement.  The patient stated  that she is comfortable with the plan that we discussed today and that all of her questions were answered.  A total of 45 minutes was spent counseling and coordinating the care for this patient.  Greater than 50% of the time was spent in direct face-to-face contact.

## 2024-01-22 ENCOUNTER — Other Ambulatory Visit: Payer: Self-pay | Admitting: Obstetrics & Gynecology

## 2024-01-22 ENCOUNTER — Other Ambulatory Visit: Payer: Self-pay

## 2024-01-22 ENCOUNTER — Ambulatory Visit: Payer: Medicaid Other | Attending: Obstetrics

## 2024-01-22 ENCOUNTER — Ambulatory Visit (HOSPITAL_BASED_OUTPATIENT_CLINIC_OR_DEPARTMENT_OTHER): Payer: Medicaid Other

## 2024-01-22 ENCOUNTER — Encounter (HOSPITAL_COMMUNITY): Payer: Self-pay | Admitting: Obstetrics & Gynecology

## 2024-01-22 ENCOUNTER — Inpatient Hospital Stay (HOSPITAL_COMMUNITY)
Admission: AD | Admit: 2024-01-22 | Discharge: 2024-01-22 | Disposition: A | Discharge status: Home / Self Care | Payer: Medicaid Other | Attending: Obstetrics & Gynecology | Admitting: Obstetrics & Gynecology

## 2024-01-22 DIAGNOSIS — E1169 Type 2 diabetes mellitus with other specified complication: Secondary | ICD-10-CM | POA: Diagnosis not present

## 2024-01-22 DIAGNOSIS — O26872 Cervical shortening, second trimester: Secondary | ICD-10-CM | POA: Diagnosis not present

## 2024-01-22 DIAGNOSIS — O10912 Unspecified pre-existing hypertension complicating pregnancy, second trimester: Secondary | ICD-10-CM | POA: Diagnosis present

## 2024-01-22 DIAGNOSIS — O10012 Pre-existing essential hypertension complicating pregnancy, second trimester: Secondary | ICD-10-CM

## 2024-01-22 DIAGNOSIS — O3432 Maternal care for cervical incompetence, second trimester: Secondary | ICD-10-CM | POA: Diagnosis present

## 2024-01-22 DIAGNOSIS — O4702 False labor before 37 completed weeks of gestation, second trimester: Secondary | ICD-10-CM | POA: Insufficient documentation

## 2024-01-22 DIAGNOSIS — E669 Obesity, unspecified: Secondary | ICD-10-CM

## 2024-01-22 DIAGNOSIS — Z3A15 15 weeks gestation of pregnancy: Secondary | ICD-10-CM | POA: Insufficient documentation

## 2024-01-22 DIAGNOSIS — Z7984 Long term (current) use of oral hypoglycemic drugs: Secondary | ICD-10-CM | POA: Diagnosis not present

## 2024-01-22 DIAGNOSIS — O09292 Supervision of pregnancy with other poor reproductive or obstetric history, second trimester: Secondary | ICD-10-CM | POA: Insufficient documentation

## 2024-01-22 DIAGNOSIS — O24112 Pre-existing diabetes mellitus, type 2, in pregnancy, second trimester: Secondary | ICD-10-CM | POA: Insufficient documentation

## 2024-01-22 DIAGNOSIS — O99211 Obesity complicating pregnancy, first trimester: Secondary | ICD-10-CM | POA: Diagnosis not present

## 2024-01-22 DIAGNOSIS — O09299 Supervision of pregnancy with other poor reproductive or obstetric history, unspecified trimester: Secondary | ICD-10-CM | POA: Insufficient documentation

## 2024-01-22 DIAGNOSIS — O99212 Obesity complicating pregnancy, second trimester: Secondary | ICD-10-CM | POA: Diagnosis not present

## 2024-01-22 LAB — URINALYSIS, ROUTINE W REFLEX MICROSCOPIC
Bilirubin Urine: NEGATIVE
Glucose, UA: NEGATIVE mg/dL
Hgb urine dipstick: NEGATIVE
Ketones, ur: NEGATIVE mg/dL
Leukocytes,Ua: NEGATIVE
Nitrite: NEGATIVE
Protein, ur: NEGATIVE mg/dL
Specific Gravity, Urine: 1.024 (ref 1.005–1.030)
pH: 6 (ref 5.0–8.0)

## 2024-01-22 LAB — WET PREP, GENITAL
Clue Cells Wet Prep HPF POC: NONE SEEN
Sperm: NONE SEEN
Trich, Wet Prep: NONE SEEN
WBC, Wet Prep HPF POC: >=10 — AB (ref <10)
Yeast Wet Prep HPF POC: NONE SEEN

## 2024-01-22 NOTE — MAU Provider Note (Signed)
 History     CSN: [id number]  Arrival date & time 01/22/24  1531   Event Date/Time   First Provider Initiated Contact with Patient 01/22/24 1645      Chief Complaint  Patient presents with   abdominal tightness    HPI Patient presenting for evaluation today for pressure in her lower abdomen with concern for contractions.  Patient reports that she was seen in MFM today and told that she has a short cervix on that they were noticing contractions while they were doing the ultrasound.  Reports that after she left she is unsure if she was having contractions which she called her OB provider and spoke to a triage nurse who then spoke with a provider in the practice who told her to come to be evaluated.  Past Medical History:  Diagnosis Date   ADHD    Anxiety    Depression    Diabetes mellitus without complication (HCC)    HSV infection    PCOS (polycystic ovarian syndrome)     Past Surgical History:  Procedure Laterality Date   SHOULDER SURGERY      No family history on file.  Social History   Tobacco Use   Smoking status: Former    Types: Cigarettes   Smokeless tobacco: Never  Vaping Use   Vaping status: Former  Substance Use Topics   Alcohol use: Not Currently    Comment: weekly   Drug use: Not Currently    OB History     Gravida  4   Para      Term      Preterm      AB  3   Living         SAB  3   IAB      Ectopic      Multiple      Live Births              Review of Systems  Genitourinary:  Positive for vaginal discharge. Negative for vaginal bleeding.  All other systems reviewed and are negative.   Allergies  Patient has no known allergies.  Home Medications    BP 122/77 (BP Location: Right Arm)   Pulse 97   Temp 98.6 F (37 C) (Oral)   Resp 16   Ht 5' 8 (1.727 m)   Wt 108.6 kg   LMP 10/07/2023   SpO2 100%   BMI 36.40 kg/m   Physical Exam Vitals reviewed. Exam conducted with a chaperone present.  Constitutional:       Appearance: Normal appearance.  HENT:     Head: Normocephalic and atraumatic.     Mouth/Throat:     Mouth: Mucous membranes are moist.  Eyes:     Extraocular Movements: Extraocular movements intact.     Pupils: Pupils are equal, round, and reactive to light.  Cardiovascular:     Rate and Rhythm: Normal rate and regular rhythm.     Pulses: Normal pulses.  Pulmonary:     Effort: Pulmonary effort is normal.  Abdominal:     General: Abdomen is flat. Bowel sounds are normal.     Palpations: Abdomen is soft.  Genitourinary:    Cervix: Discharge present.     Comments: Cervical os closed Musculoskeletal:     Cervical back: Normal range of motion.  Skin:    General: Skin is warm.     Capillary Refill: Capillary refill takes less than 2 seconds.  Neurological:     General: No  focal deficit present.     Mental Status: She is alert.  Psychiatric:        Mood and Affect: Mood normal.     MAU Course  Procedures (including critical care time) Wet prep Pelvic exam Labs Reviewed  WET PREP, GENITAL  URINALYSIS, ROUTINE W REFLEX MICROSCOPIC   US  MFM OB Transvaginal Result Date: 01/22/2024 ----------------------------------------------------------------------  OBSTETRICS REPORT                       (Signed Final 01/22/2024 03:53 pm) ---------------------------------------------------------------------- Patient Info  ID #:       [id number]                          D.O.B.:  February 16, 1991 (32 yrs)(F)  Name:       Tammy [name]                  Visit Date: 01/22/2024 11:29 am ---------------------------------------------------------------------- Performed By  Attending:        Steffan Keys MD         Ref. Address:     [address] [place]                                                             [address]                                                             [place]                                                             [address]  Performed By:     Tammy [name]            [facility]  Referred By:      Tammy [name]                    OB GYN ---------------------------------------------------------------------- Orders  #  Description                           Code        Ordered By  1  US  MFM OB TRANSVAGINAL                76817.2     Tammy [name]  2  US  MFM OB LIMITED                     [id number]    Tammy [name] ----------------------------------------------------------------------  #  Order #                     Accession #                Episode #  1  [id number]                   [id number]                 [id number]  2  [id number]                   [id number]                 [id number] ---------------------------------------------------------------------- Indications  Pre-existing diabetes, type 2, in pregnancy,   O24.111  first trimester  Obesity complicating pregnancy, first          O99.211  trimester  Poor obstetric history: Previous IUFD          O09.299  (stillbirth)  Pre-existing essential hypertension            O10.011  complicating pregnancy, first trimester  [redacted] weeks gestation of pregnancy                Z3A.15 ---------------------------------------------------------------------- Fetal Evaluation  Num Of Fetuses:         1  Fetal Heart Rate(bpm):  133  Cardiac Activity:       Observed  Presentation:           Variable  Placenta:               Visualized  P. Cord Insertion:      Visualized, central  Amniotic Fluid  AFI FV:      Within normal limits ---------------------------------------------------------------------- Gestational Age  LMP:           15w [name]        Date:  10/07/23                 EDD:   07/13/24  Best:          Tammy [name]     Det. By:  LMP  (10/07/23)          EDD:   07/13/24 ---------------------------------------------------------------------- Anatomy   Cranium:               Visualized             Abdomen:                Visualized  Ventricles:            Visualized             Abdominal Wall:  Visualized  Choroid Plexus:        Visualized             Cord Vessels:           Visualized  Cerebellum:            Visualized             Bladder:                Visualized  Heart:                 Visualized             Upper Extremities:      Visualized  Diaphragm:             Visualized             Lower Extremities:      Visualized  Stomach:               Visualized ---------------------------------------------------------------------- Cervix Uterus Adnexa  Cervix  Appears funneled, see comments  Uterus  No abnormality visualized.  Right Ovary  Size(cm)     3.54   x   2.81   x  1.25      Vol(ml): 6.51  Within normal limits.  Left Ovary  Not visualized.  Cul De Sac  No free fluid seen.  Adnexa  No free fluid ---------------------------------------------------------------------- Comments  Tammy [name] is currently at 15 weeks and 2 days.  She  was seen for a cervical length measurement today due to her  prior loss at 16+ weeks following PPROM.  Her pregnancy has also been complicated by maternal  obesity, chronic hypertension treated with labetalol , and  pregestational diabetes treated with metformin .  She denies any problems since her last exam and denies  feeling any lower abdominal cramping or contractions.  There was normal amniotic fluid noted on today's exam.  The fetus was in the breech presentation.  A transvaginal ultrasound performed today shows a  shortened and funneled cervix.  The remaining cervical length  appears to be less than 1 cm.  Due to the shortened and funneled cervix noted today along  with her past obstetrical history of a prior 16-week loss, a  cervical cerclage is recommended to try to salvage this  pregnancy.  A digital exam performed today showed that her cervix is still  closed and about 50% effaced.  I will notify her OB team who  will schedule the patient for a  rescue cerclage tomorrow.  Dr. Arna from the MFM department will be able to help  with the placement of the cerclage.  The patient understands that a cervical cerclage may not  guarantee that she will have a successful pregnancy  outcome.  However, based on the appearance of her cervix, a  cerclage is the best option at this time to salvage her  pregnancy.  The patient stated that she understood everything that was  discussed with her and is willing to have the cerclage placed.  She stated that all of her questions were answered today.  A total of 30 minutes was spent counseling and coordinating  the care for this patient.  Greater than 50% of the time was  spent in direct face-to-face contact. ----------------------------------------------------------------------                  Tammy Keys, MD Electronically Signed Final Report   01/22/2024 03:53 pm ----------------------------------------------------------------------   US  MFM OB LIMITED Result  Date: 01/22/2024 ----------------------------------------------------------------------  OBSTETRICS REPORT                       (Signed Final 01/22/2024 03:53 pm) ---------------------------------------------------------------------- Patient Info  ID #:       [id number]                          D.O.B.:  04-Aug-1991 (32 yrs)(F)  Name:       Tammy [name]                  Visit Date: 01/22/2024 11:29 am ---------------------------------------------------------------------- Performed By  Attending:        Steffan Keys MD         Ref. Address:     [address] [place]                                                             [address]                                                             [place]                                                             [address]  Performed By:     Tammy [name]            [facility]  Referred By:      [name] ---------------------------------------------------------------------- Orders  #  Description  Code        Ordered By  1  US  MFM OB TRANSVAGINAL                [id number]     Tammy [name]  2  US  MFM OB LIMITED                     [id number]    Tammy [name] ----------------------------------------------------------------------  #  Order #                     Accession #                Episode #  1  [id number]                   [id number]                 [id number]  2  [id number]                   [id number]                 [id number] ---------------------------------------------------------------------- Indications  Pre-existing diabetes, type 2, in pregnancy,   O24.111  first trimester  Obesity complicating pregnancy, first          O99.211  trimester  Poor obstetric history: Previous IUFD          O09.299  (stillbirth)  Pre-existing essential hypertension            O10.011  complicating pregnancy, first trimester  [redacted] weeks gestation of pregnancy                Z3A.15 ---------------------------------------------------------------------- Fetal Evaluation  Num Of Fetuses:         1  Fetal Heart Rate(bpm):  133  Cardiac Activity:       Observed  Presentation:           Variable  Placenta:               Visualized  P. Cord Insertion:      Visualized, central  Amniotic Fluid  AFI FV:      Within normal limits ---------------------------------------------------------------------- Gestational Age  LMP:           15w [name]        Date:  10/07/23                 EDD:   07/13/24  Best:          Tammy [name]     Det. By:  LMP  (10/07/23)          EDD:   07/13/24 ---------------------------------------------------------------------- Anatomy  Cranium:               Visualized             Abdomen:                Visualized  Ventricles:            Visualized              Abdominal Wall:         Visualized  Choroid Plexus:        Visualized             Cord Vessels:           Visualized  Cerebellum:            Visualized  Bladder:                Visualized  Heart:                 Visualized             Upper Extremities:      Visualized  Diaphragm:             Visualized             Lower Extremities:      Visualized  Stomach:               Visualized ---------------------------------------------------------------------- Cervix Uterus Adnexa  Cervix  Appears funneled, see comments  Uterus  No abnormality visualized.  Right Ovary  Size(cm)     3.54   x   2.81   x  1.25      Vol(ml): 6.51  Within normal limits.  Left Ovary  Not visualized.  Cul De Sac  No free fluid seen.  Adnexa  No free fluid ---------------------------------------------------------------------- Comments  Erendida Bossler is currently at 15 weeks and 2 days.  She  was seen for a cervical length measurement today due to her  prior loss at 16+ weeks following PPROM.  Her pregnancy has also been complicated by maternal  obesity, chronic hypertension treated with labetalol , and  pregestational diabetes treated with metformin .  She denies any problems since her last exam and denies  feeling any lower abdominal cramping or contractions.  There was normal amniotic fluid noted on today's exam.  The fetus was in the breech presentation.  A transvaginal ultrasound performed today shows a  shortened and funneled cervix.  The remaining cervical length  appears to be less than 1 cm.  Due to the shortened and funneled cervix noted today along  with her past obstetrical history of a prior 16-week loss, a  cervical cerclage is recommended to try to salvage this  pregnancy.  A digital exam performed today showed that her cervix is still  closed and about 50% effaced.  I will notify her OB team who will schedule the patient for a  rescue cerclage tomorrow.  Dr. Arna from the MFM department will be able to help  with  the placement of the cerclage.  The patient understands that a cervical cerclage may not  guarantee that she will have a successful pregnancy  outcome.  However, based on the appearance of her cervix, a  cerclage is the best option at this time to salvage her  pregnancy.  The patient stated that she understood everything that was  discussed with her and is willing to have the cerclage placed.  She stated that all of her questions were answered today.  A total of 30 minutes was spent counseling and coordinating  the care for this patient.  Greater than 50% of the time was  spent in direct face-to-face contact. ----------------------------------------------------------------------                  Tammy Keys, MD Electronically Signed Final Report   01/22/2024 03:53 pm ----------------------------------------------------------------------     No diagnosis found. MDM  Ivori Sivils is a 33 year old G4, P0 presenting at 15 weeks 2 days for concern about contractions.  Evaluation Unclear if patient is having contractions today.  She reports they were seen at MFM and was told she was having contractions on the ultrasound.  Reports she called her clinic and was told to come to the [name] for continued monitoring.  Speculum exam showed closed cervix.  Spoke with on-call provider who reports that she is scheduled for cerclage for short cervix tomorrow she is okay for discharge home.  Discussed strict return precautions and patient discharged.

## 2024-01-22 NOTE — Progress Notes (Signed)
 MFM Consult Note  Tammy [name] is currently at 15 weeks and 2 days.  She was seen for a cervical length measurement today due to her prior loss at 16+ weeks following PPROM.    Her pregnancy has also been complicated by maternal obesity, chronic hypertension treated with labetalol , and pregestational diabetes treated with metformin .    She denies any problems since her last exam and denies feeling any lower abdominal cramping or contractions.  There was normal amniotic fluid noted on today's exam.    The fetus was in the breech presentation.    A transvaginal ultrasound performed today shows a shortened and funneled cervix.  The remaining cervical length appears to be less than 1 cm.  Due to the shortened and funneled cervix noted today along with her past obstetrical history of a prior 16-week loss, a cervical cerclage is recommended to try to salvage this pregnancy.  A digital exam performed today showed that her cervix is still closed and about 50% effaced.  I will notify her OB team who will schedule the patient for a rescue cerclage tomorrow.    Dr. Arna from the MFM department will be able to help with the placement of the cerclage.    The patient understands that a cervical cerclage may not guarantee that she will have a successful pregnancy outcome.  However, based on the appearance of her cervix, a cerclage is the best option at this time to salvage her pregnancy.    The patient stated that she understood everything that was discussed with her and is willing to have the cerclage placed.    She stated that all of her questions were answered today.  A total of 30 minutes was spent counseling and coordinating the care for this patient.  Greater than 50% of the time was spent in direct face-to-face contact.

## 2024-01-22 NOTE — Discharge Instructions (Signed)
 It was a pleasure taking care of you today.  Your cervical exam was still closed.  You are scheduled for a cerclage tomorrow at 3 PM.  Please return to the hospital by 1 PM and do not eat anything for 8 hours prior to the procedure.  If you have any worsening pain please return for further evaluation.  I hope you have a great night!

## 2024-01-22 NOTE — MAU Note (Addendum)
.  Tammy [name] is a 33 y.o. at [redacted]w[redacted]d here in MAU reporting: Reports they saw CTX's on US  today with MFM. She reports they monitored her for a while and saw they went away. She reports she is unaware of what CTX's feel like. She reports whatever she is feeling is ongoing. She reports she is unsure if it is fetal movement or CTX's. She reports she had a cervical exam and her cervix was closed. She reports she was told to go home and not do anything strenuous. She reports she called her OB and they instructed her to come in and be evaluated. She reports occasional tightness in her abdomen but no pain. Denies VB or LOF.   Hx three miscarriages. She reports she lost a baby at 16w last year in August.  MFM US  today states: Cervix Appears funneled, see comments.  Onset of complaint: Today Pain score: Denies pain.  Vitals:   01/22/24 1558  BP: 122/77  Pulse: 97  Resp: 16  Temp: 98.6 F (37 C)  SpO2: 100%     FHT: 150 doppler Lab orders placed from triage: UA

## 2024-01-23 ENCOUNTER — Encounter (HOSPITAL_COMMUNITY): Payer: Self-pay | Admitting: Obstetrics and Gynecology

## 2024-01-23 ENCOUNTER — Inpatient Hospital Stay (HOSPITAL_COMMUNITY): Payer: Medicaid Other | Admitting: Anesthesiology

## 2024-01-23 ENCOUNTER — Encounter (HOSPITAL_COMMUNITY)
Admission: RE | Disposition: A | Discharge status: Home / Self Care | Payer: Self-pay | Source: Home / Self Care | Attending: Obstetrics and Gynecology

## 2024-01-23 ENCOUNTER — Observation Stay (HOSPITAL_COMMUNITY)
Admission: RE | Admit: 2024-01-23 | Discharge: 2024-01-23 | Disposition: A | Discharge status: Home / Self Care | Payer: Medicaid Other | Attending: Obstetrics and Gynecology | Admitting: Obstetrics and Gynecology

## 2024-01-23 ENCOUNTER — Other Ambulatory Visit: Payer: Self-pay

## 2024-01-23 DIAGNOSIS — Z79899 Other long term (current) drug therapy: Secondary | ICD-10-CM | POA: Insufficient documentation

## 2024-01-23 DIAGNOSIS — I10 Essential (primary) hypertension: Secondary | ICD-10-CM

## 2024-01-23 DIAGNOSIS — O24112 Pre-existing diabetes mellitus, type 2, in pregnancy, second trimester: Secondary | ICD-10-CM | POA: Diagnosis not present

## 2024-01-23 DIAGNOSIS — Z87891 Personal history of nicotine dependence: Secondary | ICD-10-CM | POA: Diagnosis not present

## 2024-01-23 DIAGNOSIS — Z7984 Long term (current) use of oral hypoglycemic drugs: Secondary | ICD-10-CM | POA: Insufficient documentation

## 2024-01-23 DIAGNOSIS — Z7982 Long term (current) use of aspirin: Secondary | ICD-10-CM | POA: Insufficient documentation

## 2024-01-23 DIAGNOSIS — E119 Type 2 diabetes mellitus without complications: Secondary | ICD-10-CM

## 2024-01-23 DIAGNOSIS — O26872 Cervical shortening, second trimester: Secondary | ICD-10-CM | POA: Diagnosis present

## 2024-01-23 DIAGNOSIS — O3430 Maternal care for cervical incompetence, unspecified trimester: Secondary | ICD-10-CM | POA: Diagnosis not present

## 2024-01-23 DIAGNOSIS — Z3A15 15 weeks gestation of pregnancy: Secondary | ICD-10-CM | POA: Diagnosis not present

## 2024-01-23 HISTORY — PX: CERVICAL CERCLAGE: SHX1329

## 2024-01-23 LAB — CBC
HCT: 34.2 % — ABNORMAL LOW (ref 36.0–46.0)
Hemoglobin: 11.1 g/dL — ABNORMAL LOW (ref 12.0–15.0)
MCH: 26.5 pg (ref 26.0–34.0)
MCHC: 32.5 g/dL (ref 30.0–36.0)
MCV: 81.6 fL (ref 80.0–100.0)
Platelets: 323 10*3/uL (ref 150–400)
RBC: 4.19 MIL/uL (ref 3.87–5.11)
RDW: 13.7 % (ref 11.5–15.5)
WBC: 5.4 10*3/uL (ref 4.0–10.5)
nRBC: 0 % (ref 0.0–0.2)

## 2024-01-23 LAB — TYPE AND SCREEN
ABO/RH(D): A POS
Antibody Screen: NEGATIVE

## 2024-01-23 LAB — GLUCOSE, CAPILLARY: Glucose-Capillary: 80 mg/dL (ref 70–99)

## 2024-01-23 SURGERY — CERCLAGE, CERVIX, VAGINAL APPROACH
Anesthesia: Spinal | Laterality: Bilateral

## 2024-01-23 MED ORDER — SODIUM CHLORIDE (PF) 0.9 % IJ SOLN
Freq: Once | INTRAMUSCULAR | Status: AC
  Filled 2024-01-23: qty 1

## 2024-01-23 MED ORDER — LACTATED RINGERS IV SOLN: INTRAVENOUS | Status: DC

## 2024-01-23 MED ORDER — HYDROMORPHONE HCL 1 MG/ML IJ SOLN: 0.2500 mg | INTRAMUSCULAR | Status: DC | PRN

## 2024-01-23 MED ORDER — FENTANYL CITRATE (PF) 100 MCG/2ML IJ SOLN
INTRAMUSCULAR | Status: AC
  Filled 2024-01-23: qty 2

## 2024-01-23 MED ORDER — DEXMEDETOMIDINE HCL IN NACL 80 MCG/20ML IV SOLN
INTRAVENOUS | Status: AC
  Filled 2024-01-23: qty 20

## 2024-01-23 MED ORDER — SODIUM CHLORIDE 0.9 % IV SOLN: 12.5000 mg | INTRAVENOUS | Status: DC | PRN

## 2024-01-23 MED ORDER — MIDAZOLAM HCL 2 MG/2ML IJ SOLN
INTRAMUSCULAR | Status: AC
  Filled 2024-01-23: qty 2

## 2024-01-23 MED ORDER — FENTANYL CITRATE (PF) 100 MCG/2ML IJ SOLN
INTRAMUSCULAR | Status: DC | PRN
  Administered 2024-01-23: 85 ug via INTRAVENOUS

## 2024-01-23 MED ORDER — OXYCODONE HCL 5 MG/5ML PO SOLN: 5.0000 mg | Freq: Once | ORAL | Status: DC | PRN

## 2024-01-23 MED ORDER — CHLOROPROCAINE HCL (PF) 3 % IJ SOLN
INTRAMUSCULAR | Status: DC | PRN
  Administered 2024-01-23: 1.6 mL

## 2024-01-23 MED ORDER — OXYCODONE HCL 5 MG PO TABS: 5.0000 mg | ORAL_TABLET | Freq: Once | ORAL | Status: DC | PRN

## 2024-01-23 MED ORDER — MIDAZOLAM HCL 2 MG/2ML IJ SOLN
INTRAMUSCULAR | Status: DC | PRN
  Administered 2024-01-23 (×2): 1 mg via INTRAVENOUS

## 2024-01-23 MED ORDER — FENTANYL CITRATE (PF) 100 MCG/2ML IJ SOLN
INTRAMUSCULAR | Status: DC | PRN
  Administered 2024-01-23: 15 ug via INTRATHECAL

## 2024-01-23 MED ORDER — DEXMEDETOMIDINE HCL IN NACL 80 MCG/20ML IV SOLN
INTRAVENOUS | Status: DC | PRN
  Administered 2024-01-23: 4 ug via INTRAVENOUS
  Administered 2024-01-23: 8 ug via INTRAVENOUS

## 2024-01-23 MED ORDER — POVIDONE-IODINE 10 % EX SWAB
2.0000 | Freq: Once | CUTANEOUS | Status: AC
  Administered 2024-01-23: 2 via TOPICAL

## 2024-01-23 SURGICAL SUPPLY — 18 items
CANISTER SUCT 3000ML PPV (MISCELLANEOUS) ×1 IMPLANT
CATH CERVICAL RIPENING BALLOON (CATHETERS) ×1 IMPLANT
ELECT REM PT RETURN 9FT ADLT (ELECTROSURGICAL) ×1 IMPLANT
ELECTRODE REM PT RTRN 9FT ADLT (ELECTROSURGICAL) ×1 IMPLANT
GLOVE BIO SURGEON STRL SZ7.5 (GLOVE) ×1 IMPLANT
GLOVE BIOGEL PI IND STRL 8 (GLOVE) ×1 IMPLANT
GOWN STRL REUS W/ TWL LRG LVL3 (GOWN DISPOSABLE) ×2 IMPLANT
HIBICLENS CHG 4% 4OZ BTL (MISCELLANEOUS) ×1 IMPLANT
PACK VAGINAL MINOR WOMEN LF (CUSTOM PROCEDURE TRAY) ×1 IMPLANT
PAD OB MATERNITY 4.3X12.25 (PERSONAL CARE ITEMS) ×1 IMPLANT
PAD PREP 24X48 CUFFED NSTRL (MISCELLANEOUS) ×1 IMPLANT
PENCIL BUTTON HOLSTER BLD 10FT (ELECTRODE) ×1 IMPLANT
SUT MERSILENE FIBER S 5 MO-4 1 (SUTURE) ×1 IMPLANT
SYR BULB IRRIGATION 50ML (SYRINGE) IMPLANT
TOWEL OR 17X24 6PK STRL BLUE (TOWEL DISPOSABLE) ×2 IMPLANT
TRAY FOLEY W/BAG SLVR 14FR (SET/KITS/TRAYS/PACK) ×1 IMPLANT
TUBING NON-CON 1/4 X 20 CONN (TUBING) ×1 IMPLANT
YANKAUER SUCT BULB TIP NO VENT (SUCTIONS) ×1 IMPLANT

## 2024-01-23 NOTE — Transfer of Care (Signed)
 Immediate Anesthesia Transfer of Care Note  Patient: Tammy [name]  Procedure(s) Performed: CERCLAGE CERVICAL (Bilateral)  Patient Location: PACU  Anesthesia Type:Spinal  Level of Consciousness: awake, alert , and oriented  Airway & Oxygen Therapy: Patient Spontanous Breathing  Post-op Assessment: Report given to RN and Post -op Vital signs reviewed and stable  Post vital signs: Reviewed and stable  Last Vitals:  Vitals Value Taken Time  BP 121/66 01/23/24 1643  Temp 36.6 C 01/23/24 1643  Pulse 74 01/23/24 1653  Resp 12 01/23/24 1653  SpO2 99 % 01/23/24 1653  Vitals shown include unfiled device data.  Last Pain:  Vitals:   01/23/24 1645  TempSrc:   PainSc: 0-No pain         Complications: No notable events documented.

## 2024-01-23 NOTE — Anesthesia Postprocedure Evaluation (Signed)
 Anesthesia Post Note  Patient: Roniesha Sturdevant  Procedure(s) Performed: CERCLAGE CERVICAL (Bilateral)     Patient location during evaluation: PACU Anesthesia Type: Spinal Level of consciousness: awake and alert and oriented Pain management: pain level controlled Vital Signs Assessment: post-procedure vital signs reviewed and stable Respiratory status: spontaneous breathing, nonlabored ventilation and respiratory function stable Cardiovascular status: blood pressure returned to baseline and stable Postop Assessment: no headache, no backache, spinal receding and patient able to bend at knees Anesthetic complications: no   No notable events documented.  Last Vitals:  Vitals:   01/23/24 1715 01/23/24 1717  BP: 115/72   Pulse: 81 78  Resp: 16 19  Temp:    SpO2: (!) 79% 99%    Last Pain:  Vitals:   01/23/24 1730  TempSrc:   PainSc: 0-No pain   Pain Goal:    LLE Motor Response: Purposeful movement (01/23/24 1730) LLE Sensation: Tingling (01/23/24 1730) RLE Motor Response: Purposeful movement (01/23/24 1730) RLE Sensation: Tingling (01/23/24 1730)     Epidural/Spinal Function Cutaneous sensation: Tingles (01/23/24 1730), Patient able to flex knees: Yes (01/23/24 1730), Patient able to lift hips off bed: Yes (01/23/24 1730), Back pain beyond tenderness at insertion site: No (01/23/24 1730), Progressively worsening motor and/or sensory loss: No (01/23/24 1730), Bowel and/or bladder incontinence post epidural: No (01/23/24 1730)  Almarie CHRISTELLA Marchi

## 2024-01-23 NOTE — Anesthesia Procedure Notes (Signed)
 Spinal  Patient location during procedure: OR Start time: 01/23/2024 3:50 PM End time: 01/23/2024 3:55 PM Reason for block: surgical anesthesia Staffing Performed: anesthesiologist  Anesthesiologist: Merla Almarie HERO, DO Performed by: Merla Almarie HERO, DO Authorized by: Merla Almarie HERO, DO   Preanesthetic Checklist Completed: patient identified, IV checked, risks and benefits discussed, surgical consent, monitors and equipment checked, pre-op evaluation and timeout performed Spinal Block Patient position: sitting Prep: DuraPrep and site prepped and draped Patient monitoring: cardiac monitor, continuous pulse ox and blood pressure Approach: midline Location: L3-4 Injection technique: single-shot Needle Needle type: Pencan  Needle gauge: 24 G Needle length: 9 cm Assessment Sensory level: T6 Events: CSF return Additional Notes Functioning IV was confirmed and monitors were applied. Sterile prep and drape, including hand hygiene and sterile gloves were used. The patient was positioned and the spine was prepped. The skin was anesthetized with lidocaine .  Free flow of clear CSF was obtained prior to injecting local anesthetic into the CSF.  The spinal needle aspirated freely following injection.  The needle was carefully withdrawn.  The patient tolerated the procedure well.

## 2024-01-23 NOTE — Op Note (Signed)
 Preop Diagnosis: 1.15 2/7wks 2.Short Cervix  Postop Diagnosis: 1.15 2/7wks 2.Short Cervix  Procedure: CERCLAGE CERVICAL   Anesthesia: Spinal   Anesthesiologist: Dr. Budd   Attending: Henry Slough, MD   Assistant: Mercer Peal, CNM  Findings: FT-1cm  Pathology: N/a  Fluids: 800 cc  UOP: 300 cc  EBL: 1 cc  Complications: None  Procedure: Then patient was taken to the operating room after the risks, benefits and alternatives discussed with the patient and consent signed and witnessed.  The patient was given a spinal per anesthesia and placed in the dorsal lithotomy position.  The patient was prepped and draped in the usual sterile fashion.  A cervical cerclage stitch was placed using Mersilene and the knot was tied anteriorly on the cervix with a stitch of 1 prolene at the base to help elevate knot if necessary when it comes time for removal.  Clindamycin  douche was performed.  Membranes remained intact and post procedure fetal heart rate was 140s.  Sponge, lap and needle count was correct and the patient was transferred to the recovery room in good condition.

## 2024-01-23 NOTE — H&P (Addendum)
 Tammy [name] is an 33 y.o. female. [id number] @ 15+3 with cervical insufficiency presenting for a cerclage. Cervical length less than 1 cm on 01/22/24.  Pregnancy is complicated by [facility] managed on Labetaolo and Type 2 DM managed on Metformin . Pt denies vaginal bleeding and abd cramping on admission.   Past Medical History:  Diagnosis Date   ADHD    Anxiety    Depression    Diabetes mellitus without complication (HCC)    HSV infection    PCOS (polycystic ovarian syndrome)     Past Surgical History:  Procedure Laterality Date   SHOULDER SURGERY      History reviewed. No pertinent family history.  Social History:  reports that she has quit smoking. Her smoking use included cigarettes. She has never used smokeless tobacco. She reports that she does not currently use alcohol. She reports that she does not currently use drugs.  Allergies: No Known Allergies  Medications Prior to Admission  Medication Sig Dispense Refill Last Dose/Taking   amphetamine -dextroamphetamine  (ADDERALL XR) 10 MG 24 hr capsule Take 10 mg by mouth daily.   01/22/2024   aspirin  EC 81 MG tablet Take 81 mg by mouth daily. Swallow whole.   01/22/2024   cholecalciferol (VITAMIN D3) 25 MCG (1000 UNIT) tablet Take 1,000 Units by mouth daily.   01/22/2024   escitalopram  (LEXAPRO ) 5 MG tablet Take 5 mg by mouth daily.   01/22/2024   metFORMIN  (GLUCOPHAGE ) 500 MG tablet Take 500 mg by mouth. At night   01/22/2024   Prenatal Vit-Fe Fumarate-FA (PRENATAL MULTIVITAMIN) TABS tablet Take 1 tablet by mouth daily at 12 noon.   01/22/2024   acetaminophen  (TYLENOL ) 325 MG tablet Take 2 tablets (650 mg total) by mouth every 4 (four) hours as needed (for pain scale < 4).   More than a month   buPROPion (WELLBUTRIN XL) 150 MG 24 hr tablet Take 1 tablet by mouth daily. (Patient not taking: Reported on 01/22/2024)      ondansetron  (ZOFRAN  ODT) 4 MG disintegrating tablet Take 1 tablet (4 mg total) by mouth every 8 (eight) hours as needed for nausea or  vomiting. 12 tablet 0    terconazole  (TERAZOL 7 ) 0.4 % vaginal cream Place 1 applicator vaginally at bedtime. 45 g 0     Review of Systems  All other systems reviewed and are negative.   Blood pressure 125/78, pulse 88, temperature 98 F (36.7 C), temperature source Oral, resp. rate 16, height 5' 8 (1.727 m), weight 108.4 kg, last menstrual period 10/07/2023, unknown if currently breastfeeding. Physical Exam Cardiovascular:     Rate and Rhythm: Normal rate.  Pulmonary:     Effort: Pulmonary effort is normal.  Abdominal:     Palpations: Abdomen is soft.  Genitourinary:    Comments: Deferred, to be examined during procedure Skin:    General: Skin is warm and dry.  Neurological:     Mental Status: She is alert.     Results for orders placed or performed during the [name] encounter of 01/23/24 (from the past 24 hours)  CBC     Status: Abnormal   Collection Time: 01/23/24  1:50 PM  Result Value Ref Range   WBC 5.4 4.0 - 10.5 K/uL   RBC 4.19 3.87 - 5.11 MIL/uL   Hemoglobin 11.1 (L) 12.0 - 15.0 g/dL   HCT 65.7 (L) 63.9 - 53.9 %   MCV 81.6 80.0 - 100.0 fL   MCH 26.5 26.0 - 34.0 pg   MCHC 32.5 30.0 -  36.0 g/dL   RDW 86.2 88.4 - 84.4 %   Platelets 323 150 - 400 K/uL   nRBC 0.0 0.0 - 0.2 %  Type and screen     Status: None   Collection Time: 01/23/24  1:50 PM  Result Value Ref Range   ABO/RH(D) A POS    Antibody Screen NEG    Sample Expiration      01/26/2024,2359 Performed at [facility], [address], [place], [place] [address]   Glucose, capillary     Status: None   Collection Time: 01/23/24  2:46 PM  Result Value Ref Range   Glucose-Capillary 80 70 - 99 mg/dL    US  MFM OB Transvaginal Result Date: 01/22/2024 ----------------------------------------------------------------------  OBSTETRICS REPORT                       (Signed Final 01/22/2024 03:53 pm) ---------------------------------------------------------------------- Patient Info  ID #:       [id number]                           D.O.B.:  24-Dec-1990 (32 yrs)(F)  Name:       Tammy [name]                  Visit Date: 01/22/2024 11:29 am ---------------------------------------------------------------------- Performed By  Attending:        Steffan Keys MD         Ref. Address:     [address] [place]                                                             [address]                                                             [place]                                                             [address]  Performed By:     Erminio Gentry            [facility]         [facility]                                                             [facility]                                                             [facility]  Referred By:      [facility] ---------------------------------------------------------------------- Orders  #  Description                           Code        Ordered By  1  US  MFM OB TRANSVAGINAL                [id number]     YU FANG  2  US  MFM OB LIMITED                     [id number]    YU FANG ----------------------------------------------------------------------  #  Order #                     Accession #                Episode #  1  [id number]                   [id number]                 [id number]  2  [id number]                   [id number]                 [id number] ---------------------------------------------------------------------- Indications  Pre-existing diabetes, type 2, in pregnancy,   O24.111  first trimester  Obesity complicating pregnancy, first          O99.211  trimester  Poor obstetric history: Previous IUFD          O09.299  (stillbirth)  Pre-existing essential hypertension            O10.011  complicating pregnancy, first trimester  [redacted] weeks gestation of pregnancy                Z3A.15 ---------------------------------------------------------------------- Fetal Evaluation  Num Of Fetuses:          1  Fetal Heart Rate(bpm):  133  Cardiac Activity:       Observed  Presentation:           Variable  Placenta:               Visualized  P. Cord Insertion:      Visualized, central  Amniotic Fluid  AFI FV:      Within normal limits ---------------------------------------------------------------------- Gestational Age  LMP:           15w 2d        Date:  10/07/23                 EDD:   07/13/24  Best:          Tammy [name] 2d     Det. By:  LMP  (10/07/23)          EDD:   07/13/24 ---------------------------------------------------------------------- Anatomy  Cranium:  Visualized             Abdomen:                Visualized  Ventricles:            Visualized             Abdominal Wall:         Visualized  Choroid Plexus:        Visualized             Cord Vessels:           Visualized  Cerebellum:            Visualized             Bladder:                Visualized  Heart:                 Visualized             Upper Extremities:      Visualized  Diaphragm:             Visualized             Lower Extremities:      Visualized  Stomach:               Visualized ---------------------------------------------------------------------- Cervix Uterus Adnexa  Cervix  Appears funneled, see comments  Uterus  No abnormality visualized.  Right Ovary  Size(cm)     3.54   x   2.81   x  1.25      Vol(ml): 6.51  Within normal limits.  Left Ovary  Not visualized.  Cul De Sac  No free fluid seen.  Adnexa  No free fluid ---------------------------------------------------------------------- Comments  Tammy [name] is currently at 15 weeks and 2 days.  She  was seen for a cervical length measurement today due to her  prior loss at 16+ weeks following PPROM.  Her pregnancy has also been complicated by maternal  obesity, chronic hypertension treated with labetalol , and  pregestational diabetes treated with metformin .  She denies any problems since her last exam and denies  feeling any lower abdominal cramping or contractions.   There was normal amniotic fluid noted on today's exam.  The fetus was in the breech presentation.  A transvaginal ultrasound performed today shows a  shortened and funneled cervix.  The remaining cervical length  appears to be less than 1 cm.  Due to the shortened and funneled cervix noted today along  with her past obstetrical history of a prior 16-week loss, a  cervical cerclage is recommended to try to salvage this  pregnancy.  A digital exam performed today showed that her cervix is still  closed and about 50% effaced.  I will notify her OB team who will schedule the patient for a  rescue cerclage tomorrow.  Dr. Arna from the MFM department will be able to help  with the placement of the cerclage.  The patient understands that a cervical cerclage may not  guarantee that she will have a successful pregnancy  outcome.  However, based on the appearance of her cervix, a  cerclage is the best option at this time to salvage her  pregnancy.  The patient stated that she understood everything that was  discussed with her and is willing to have the cerclage placed.  She stated that all of her questions were answered today.  A total  of 30 minutes was spent counseling and coordinating  the care for this patient.  Greater than 50% of the time was  spent in direct face-to-face contact. ----------------------------------------------------------------------                  Steffan Keys, MD Electronically Signed Final Report   01/22/2024 03:53 pm ----------------------------------------------------------------------      Assessment/Plan: 32 yo G4 P0030 @ 15+3 Cervical insufficiency w/ length <1 cm Cerclage placement Routine pre-op orders  Tammy [name] 01/23/2024, 3:16 PM  Risks benefits and alternatives discussed with the patient including but not limited to bleeding infection injury ROM and loss of pregnancy.  Pt verbalized understanding, questions answered and consent signed and witnessed.

## 2024-01-23 NOTE — Anesthesia Preprocedure Evaluation (Signed)
 Anesthesia Evaluation  Patient identified by MRN, date of birth, ID band Patient awake    Reviewed: Allergy & Precautions, H&P , NPO status , Patient's Chart, lab work & pertinent test results  Airway Mallampati: II  TM Distance: >3 FB Neck ROM: Full    Dental no notable dental hx.    Pulmonary neg pulmonary ROS, former smoker   Pulmonary exam normal breath sounds clear to auscultation       Cardiovascular hypertension, Normal cardiovascular exam Rhythm:Regular Rate:Normal     Neuro/Psych   Anxiety Depression    negative neurological ROS  negative psych ROS   GI/Hepatic negative GI ROS, Neg liver ROS,,,  Endo/Other  negative endocrine ROSdiabetes    Renal/GU negative Renal ROS  negative genitourinary   Musculoskeletal negative musculoskeletal ROS (+)    Abdominal  (+) + obese  Peds negative pediatric ROS (+)  Hematology negative hematology ROS (+)   Anesthesia Other Findings   Reproductive/Obstetrics (+) Pregnancy                             Anesthesia Physical Anesthesia Plan  ASA: 3  Anesthesia Plan: Spinal   Post-op Pain Management:    Induction:   PONV Risk Score and Plan: Treatment may vary due to age or medical condition  Airway Management Planned: Natural Airway  Additional Equipment:   Intra-op Plan:   Post-operative Plan:   Informed Consent: I have reviewed the patients History and Physical, chart, labs and discussed the procedure including the risks, benefits and alternatives for the proposed anesthesia with the patient or authorized representative who has indicated his/her understanding and acceptance.     Dental advisory given  Plan Discussed with: CRNA  Anesthesia Plan Comments:        Anesthesia Quick Evaluation

## 2024-01-24 ENCOUNTER — Encounter (HOSPITAL_COMMUNITY): Payer: Self-pay | Admitting: Obstetrics and Gynecology

## 2024-01-29 NOTE — Discharge Summary (Signed)
 Physician Discharge Summary  Patient ID: Tammy [name] MRN: [id number] DOB/AGE: 15-Dec-1991 32 y.o.  Admit date: 01/23/2024 Discharge date: 01/29/2024  Admission Diagnoses: Short cervix Discharge Diagnoses:  Active Problems:   * No active hospital problems. *   Discharged Condition: good  Hospital Course: Cerclage placed without difficulty and pt discharged home with precautions.  Consults: None  Significant Diagnostic Studies: n/a  Treatments: surgery: cervical cerclage placed  Discharge Exam: Blood pressure 115/72, pulse 78, temperature 97.9 F (36.6 C), temperature source Oral, resp. rate 19, height 5' 8 (1.727 m), weight 108.4 kg, last menstrual period 10/07/2023, SpO2 99%, unknown if currently breastfeeding. General appearance: alert, cooperative, and no distress Resp: unlabored Cardio: regularly irregular rhythm Extremities: no calf tenderness  Disposition: Discharge disposition: 01-Home or Self Care        Allergies as of 01/23/2024   No Known Allergies      Medication List     STOP taking these medications    terconazole  0.4 % vaginal cream Commonly known as: TERAZOL 7        TAKE these medications    acetaminophen  325 MG tablet Commonly known as: Tylenol  Take 2 tablets (650 mg total) by mouth every 4 (four) hours as needed (for pain scale < 4).   amphetamine -dextroamphetamine  10 MG 24 hr capsule Commonly known as: ADDERALL XR Take 10 mg by mouth daily.   aspirin  EC 81 MG tablet Take 81 mg by mouth daily. Swallow whole.   buPROPion 150 MG 24 hr tablet Commonly known as: WELLBUTRIN XL Take 1 tablet by mouth daily.   cholecalciferol 25 MCG (1000 UNIT) tablet Commonly known as: VITAMIN D3 Take 1,000 Units by mouth daily.   escitalopram  5 MG tablet Commonly known as: LEXAPRO  Take 5 mg by mouth daily.   metFORMIN  500 MG tablet Commonly known as: GLUCOPHAGE  Take 500 mg by mouth. At night   ondansetron  4 MG disintegrating  tablet Commonly known as: Zofran  ODT Take 1 tablet (4 mg total) by mouth every 8 (eight) hours as needed for nausea or vomiting.   prenatal multivitamin Tabs tablet Take 1 tablet by mouth daily at 12 noon.        Follow-up Information     Henry Slough, MD Follow up on 02/05/2024.   Specialty: Obstetrics and Gynecology Why: follow up visit 02/05/24 at 4p Contact information: [address] [place] [address] [phone]                 Signed: Slough CINDERELLA Henry 01/29/2024, 5:39 PM

## 2024-02-03 ENCOUNTER — Inpatient Hospital Stay (HOSPITAL_COMMUNITY): Payer: Medicaid Other

## 2024-02-03 ENCOUNTER — Inpatient Hospital Stay (HOSPITAL_COMMUNITY)
Admission: AD | Admit: 2024-02-03 | Discharge: 2024-02-10 | DRG: 768 | Disposition: A | Discharge status: Home / Self Care | Payer: Medicaid Other | Attending: Obstetrics and Gynecology | Admitting: Obstetrics and Gynecology

## 2024-02-03 ENCOUNTER — Encounter (HOSPITAL_COMMUNITY): Payer: Self-pay | Admitting: Obstetrics and Gynecology

## 2024-02-03 DIAGNOSIS — O8612 Endometritis following delivery: Secondary | ICD-10-CM | POA: Diagnosis present

## 2024-02-03 DIAGNOSIS — Z794 Long term (current) use of insulin: Secondary | ICD-10-CM

## 2024-02-03 DIAGNOSIS — N883 Incompetence of cervix uteri: Secondary | ICD-10-CM | POA: Diagnosis present

## 2024-02-03 DIAGNOSIS — Z3A17 17 weeks gestation of pregnancy: Secondary | ICD-10-CM

## 2024-02-03 DIAGNOSIS — O4292 Full-term premature rupture of membranes, unspecified as to length of time between rupture and onset of labor: Secondary | ICD-10-CM | POA: Diagnosis not present

## 2024-02-03 DIAGNOSIS — O99344 Other mental disorders complicating childbirth: Secondary | ICD-10-CM | POA: Diagnosis present

## 2024-02-03 DIAGNOSIS — B9689 Other specified bacterial agents as the cause of diseases classified elsewhere: Secondary | ICD-10-CM | POA: Diagnosis present

## 2024-02-03 DIAGNOSIS — F411 Generalized anxiety disorder: Secondary | ICD-10-CM | POA: Diagnosis present

## 2024-02-03 DIAGNOSIS — O3432 Maternal care for cervical incompetence, second trimester: Secondary | ICD-10-CM | POA: Diagnosis present

## 2024-02-03 DIAGNOSIS — O4102X0 Oligohydramnios, second trimester, not applicable or unspecified: Secondary | ICD-10-CM | POA: Diagnosis not present

## 2024-02-03 DIAGNOSIS — O99214 Obesity complicating childbirth: Secondary | ICD-10-CM | POA: Diagnosis present

## 2024-02-03 DIAGNOSIS — F329 Major depressive disorder, single episode, unspecified: Secondary | ICD-10-CM | POA: Diagnosis present

## 2024-02-03 DIAGNOSIS — O328XX0 Maternal care for other malpresentation of fetus, not applicable or unspecified: Secondary | ICD-10-CM | POA: Diagnosis present

## 2024-02-03 DIAGNOSIS — O9902 Anemia complicating childbirth: Secondary | ICD-10-CM | POA: Diagnosis present

## 2024-02-03 DIAGNOSIS — R768 Other specified abnormal immunological findings in serum: Secondary | ICD-10-CM | POA: Diagnosis present

## 2024-02-03 DIAGNOSIS — Z87891 Personal history of nicotine dependence: Secondary | ICD-10-CM

## 2024-02-03 DIAGNOSIS — O2412 Pre-existing diabetes mellitus, type 2, in childbirth: Secondary | ICD-10-CM | POA: Diagnosis present

## 2024-02-03 DIAGNOSIS — O42912 Preterm premature rupture of membranes, unspecified as to length of time between rupture and onset of labor, second trimester: Principal | ICD-10-CM | POA: Diagnosis present

## 2024-02-03 DIAGNOSIS — E119 Type 2 diabetes mellitus without complications: Secondary | ICD-10-CM

## 2024-02-03 DIAGNOSIS — F909 Attention-deficit hyperactivity disorder, unspecified type: Secondary | ICD-10-CM | POA: Diagnosis present

## 2024-02-03 DIAGNOSIS — Z7984 Long term (current) use of oral hypoglycemic drugs: Secondary | ICD-10-CM

## 2024-02-03 DIAGNOSIS — O1092 Unspecified pre-existing hypertension complicating childbirth: Secondary | ICD-10-CM | POA: Diagnosis present

## 2024-02-03 DIAGNOSIS — Z3A00 Weeks of gestation of pregnancy not specified: Secondary | ICD-10-CM | POA: Diagnosis not present

## 2024-02-03 DIAGNOSIS — A6000 Herpesviral infection of urogenital system, unspecified: Secondary | ICD-10-CM | POA: Diagnosis present

## 2024-02-03 DIAGNOSIS — O9832 Other infections with a predominantly sexual mode of transmission complicating childbirth: Secondary | ICD-10-CM | POA: Diagnosis present

## 2024-02-03 DIAGNOSIS — O42919 Preterm premature rupture of membranes, unspecified as to length of time between rupture and onset of labor, unspecified trimester: Principal | ICD-10-CM | POA: Diagnosis present

## 2024-02-03 LAB — CBC
HCT: 32 % — ABNORMAL LOW (ref 36.0–46.0)
Hemoglobin: 10.3 g/dL — ABNORMAL LOW (ref 12.0–15.0)
MCH: 26.5 pg (ref 26.0–34.0)
MCHC: 32.2 g/dL (ref 30.0–36.0)
MCV: 82.5 fL (ref 80.0–100.0)
Platelets: 331 10*3/uL (ref 150–400)
RBC: 3.88 MIL/uL (ref 3.87–5.11)
RDW: 14 % (ref 11.5–15.5)
WBC: 9 10*3/uL (ref 4.0–10.5)
nRBC: 0 % (ref 0.0–0.2)

## 2024-02-03 LAB — COMPREHENSIVE METABOLIC PANEL
ALT: 22 U/L (ref 0–44)
AST: 19 U/L (ref 15–41)
Albumin: 3.2 g/dL — ABNORMAL LOW (ref 3.5–5.0)
Alkaline Phosphatase: 45 U/L (ref 38–126)
Anion gap: 11 (ref 5–15)
BUN: 14 mg/dL (ref 6–20)
CO2: 21 mmol/L — ABNORMAL LOW (ref 22–32)
Calcium: 9.1 mg/dL (ref 8.9–10.3)
Chloride: 106 mmol/L (ref 98–111)
Creatinine, Ser: 0.85 mg/dL (ref 0.44–1.00)
GFR, Estimated: >60 mL/min (ref >60)
Glucose, Bld: 95 mg/dL (ref 70–99)
Potassium: 4 mmol/L (ref 3.5–5.1)
Sodium: 138 mmol/L (ref 135–145)
Total Bilirubin: 0.2 mg/dL (ref 0.0–1.2)
Total Protein: 6.1 g/dL — ABNORMAL LOW (ref 6.5–8.1)

## 2024-02-03 LAB — GLUCOSE, CAPILLARY: Glucose-Capillary: 86 mg/dL (ref 70–99)

## 2024-02-03 LAB — TYPE AND SCREEN
ABO/RH(D): A POS
Antibody Screen: NEGATIVE

## 2024-02-03 MED ORDER — INSULIN ASPART 100 UNIT/ML IJ SOLN: 0.0000 [IU] | Freq: Three times a day (TID) | INTRAMUSCULAR | Status: DC

## 2024-02-03 MED ORDER — LACTATED RINGERS IV SOLN: 125.0000 mL/h | INTRAVENOUS | Status: AC

## 2024-02-03 MED ORDER — SODIUM CHLORIDE 0.9 % IV SOLN: 500.0000 mg | INTRAVENOUS | Status: DC

## 2024-02-03 MED ORDER — LACTATED RINGERS IV SOLN: INTRAVENOUS | Status: AC

## 2024-02-03 MED ORDER — ACETAMINOPHEN 325 MG PO TABS
650.0000 mg | ORAL_TABLET | ORAL | Status: DC | PRN
  Administered 2024-02-05 (×2): 650 mg via ORAL
  Filled 2024-02-03 (×2): qty 2

## 2024-02-03 MED ORDER — SODIUM CHLORIDE 0.9 % IV SOLN
1.0000 g | Freq: Four times a day (QID) | INTRAVENOUS | Status: DC
  Filled 2024-02-03 (×2): qty 1000

## 2024-02-03 MED ORDER — PRENATAL MULTIVITAMIN CH
1.0000 | ORAL_TABLET | Freq: Every day | ORAL | Status: DC
  Administered 2024-02-04 – 2024-02-06 (×3): 1 via ORAL
  Filled 2024-02-03 (×4): qty 1

## 2024-02-03 MED ORDER — DOCUSATE SODIUM 100 MG PO CAPS
100.0000 mg | ORAL_CAPSULE | Freq: Every day | ORAL | Status: DC
  Administered 2024-02-05 – 2024-02-06 (×2): 100 mg via ORAL
  Filled 2024-02-03 (×4): qty 1

## 2024-02-03 MED ORDER — ZOLPIDEM TARTRATE 5 MG PO TABS
5.0000 mg | ORAL_TABLET | Freq: Every evening | ORAL | Status: DC | PRN
  Administered 2024-02-08: 5 mg via ORAL
  Filled 2024-02-03: qty 1

## 2024-02-03 MED ORDER — CALCIUM CARBONATE ANTACID 500 MG PO CHEW: 2.0000 | CHEWABLE_TABLET | ORAL | Status: DC | PRN

## 2024-02-03 NOTE — MAU Provider Note (Addendum)
 History     CSN: [id number]  Arrival date and time: 02/03/24 2054   Event Date/Time   First Provider Initiated Contact with Patient 02/03/24 2127      Chief Complaint  Patient presents with   Vaginal Bleeding   Rupture of Membranes   Vaginal Bleeding Pertinent negatives include no abdominal pain, back pain, chills, diarrhea, dysuria, fever, flank pain, nausea, rash, sore throat or vomiting.    Jemima [id number] at [redacted]w[redacted]d with known cerclage presenting for bleeding and leaking fluid. Reports an office visit today and everything was fine. Cerclage was placed on 2/7. She was using the bathroom and felt pop with bleeding and onset of leaking fluid. She took a picture and showed me this image, it appeared to be buldging membranes at the introitus. Denies pain currently. Reports continued bleeding.   OB History     Gravida  4   Para      Term      Preterm      AB  3   Living         SAB  3   IAB      Ectopic      Multiple      Live Births              Past Medical History:  Diagnosis Date   ADHD    Anxiety    Depression    Diabetes mellitus without complication (HCC)    HSV infection    PCOS (polycystic ovarian syndrome)     Past Surgical History:  Procedure Laterality Date   CERVICAL CERCLAGE Bilateral 01/23/2024   Procedure: CERCLAGE CERVICAL;  Surgeon: Osborn Coho, MD;  Location: [facility] LD ORS;  Service: Gynecology;  Laterality: Bilateral;   SHOULDER SURGERY      History reviewed. No pertinent family history.  Social History   Tobacco Use   Smoking status: Former    Types: Cigarettes   Smokeless tobacco: Never  Vaping Use   Vaping status: Former  Substance Use Topics   Alcohol use: Not Currently    Comment: weekly   Drug use: Not Currently    Allergies: No Known Allergies  Medications Prior to Admission  Medication Sig Dispense Refill Last Dose/Taking   acetaminophen (TYLENOL) 325 MG tablet Take 2 tablets (650 mg total) by mouth every 4  (four) hours as needed (for pain scale < 4).      amphetamine-dextroamphetamine (ADDERALL XR) 10 MG 24 hr capsule Take 10 mg by mouth daily.      aspirin EC 81 MG tablet Take 81 mg by mouth daily. Swallow whole.      buPROPion (WELLBUTRIN XL) 150 MG 24 hr tablet Take 1 tablet by mouth daily. (Patient not taking: Reported on 01/22/2024)      cholecalciferol (VITAMIN D3) 25 MCG (1000 UNIT) tablet Take 1,000 Units by mouth daily.      escitalopram (LEXAPRO) 5 MG tablet Take 5 mg by mouth daily.      metFORMIN (GLUCOPHAGE) 500 MG tablet Take 500 mg by mouth. At night      ondansetron (ZOFRAN ODT) 4 MG disintegrating tablet Take 1 tablet (4 mg total) by mouth every 8 (eight) hours as needed for nausea or vomiting. 12 tablet 0    Prenatal Vit-Fe Fumarate-FA (PRENATAL MULTIVITAMIN) TABS tablet Take 1 tablet by mouth daily at 12 noon.       Review of Systems  Constitutional:  Negative for chills and fever.  HENT:  Negative for congestion  and sore throat.   Eyes:  Negative for pain and visual disturbance.  Respiratory:  Negative for cough, chest tightness and shortness of breath.   Cardiovascular:  Negative for chest pain.  Gastrointestinal:  Negative for abdominal pain, diarrhea, nausea and vomiting.  Endocrine: Negative for cold intolerance and heat intolerance.  Genitourinary:  Positive for vaginal bleeding. Negative for dysuria and flank pain.  Musculoskeletal:  Negative for back pain.  Skin:  Negative for rash.  Allergic/Immunologic: Negative for food allergies.  Neurological:  Negative for dizziness and light-headedness.  Psychiatric/Behavioral:  Negative for agitation.    Physical Exam   Blood pressure (!) 149/90, pulse 99, temperature 98.4 F (36.9 C), resp. rate 19, height 5\' 8"  (1.727 m), weight 106.6 kg, last menstrual period 10/07/2023, SpO2 98%, unknown if currently breastfeeding.  Physical Exam Vitals and nursing note reviewed. Exam conducted with a chaperone present.   Constitutional:      General: She is not in acute distress.    Appearance: She is well-developed.     Comments: Pregnant female  HENT:     Head: Normocephalic and atraumatic.  Eyes:     General: No scleral icterus.    Conjunctiva/sclera: Conjunctivae normal.  Cardiovascular:     Rate and Rhythm: Normal rate.  Pulmonary:     Effort: Pulmonary effort is normal.  Chest:     Chest wall: No tenderness.  Abdominal:     Palpations: Abdomen is soft.     Tenderness: There is no abdominal tenderness. There is no guarding or rebound.     Comments: Gravid  Genitourinary:    Exam position: Lithotomy position.     Vagina: Bleeding present.     Cervix: Cervical bleeding present.     Uterus: Enlarged.      Comments: + blood in the vault, removed clot. There were draping membranes from the cervical os.  Merseline cerclage is present Musculoskeletal:        General: Normal range of motion.     Cervical back: Normal range of motion and neck supple.  Skin:    General: Skin is warm and dry.     Findings: No rash.  Neurological:     Mental Status: She is alert and oriented to person, place, and time.    FHR 154  MAU Course  Procedures  MDM- High Suspect previable PPROM given presentation - Type and Screen --CBC --[place] ordered for AFI  US showed anhydramnios Fetal heart rate 140 Placenta posterior Breech presentaiton  Dr Normand Sloop notified of results, history and presentation Will admit for antibiotics and they will see her in AM  Assessment and Plan  A;  Single IUP at [redacted]w[redacted]d       Cerclage in place       Preterm Premature Rupture of Membranes  P;   Admit to [facility]       Dr Normand Sloop to place orders       Latency antibiotics       MD to follow  Aviva Signs, CNM   Isa Rankin [facility] 02/03/2024, 10:21 PM

## 2024-02-03 NOTE — H&P (Signed)
 [name] is 12-21-1990  Patient's last menstrual period was 10/07/2023. [redacted]w[redacted]d  Pt presents with Preterm premature rupture of membranes (PPROM) delivered, current hospitalization [O42.919]  Pregnancy complicated by Incompetent cervix.  Pt has a cerclage Type 2 DM Obesity PMHX  Past Medical History:  Diagnosis Date   ADHD    Anxiety    Depression    Diabetes mellitus without complication (HCC)    HSV infection    PCOS (polycystic ovarian syndrome)     PSURG HX  Past Surgical History:  Procedure Laterality Date   CERVICAL CERCLAGE Bilateral 01/23/2024   Procedure: CERCLAGE CERVICAL;  Surgeon: Osborn Coho, MD;  Location: [facility] LD ORS;  Service: Gynecology;  Laterality: Bilateral;   SHOULDER SURGERY      Fam HX History reviewed. No pertinent family history.  Soc HX  Social History   Socioeconomic History   Marital status: Single    Spouse name: Not on file   Number of children: Not on file   Years of education: Not on file   Highest education level: Not on file  Occupational History   Not on file  Tobacco Use   Smoking status: Former    Types: Cigarettes   Smokeless tobacco: Never  Vaping Use   Vaping status: Former  Substance and Sexual Activity   Alcohol use: Not Currently    Comment: weekly   Drug use: Not Currently   Sexual activity: Yes    Birth control/protection: None  Other Topics Concern   Not on file  Social History Narrative   Not on file   Social Drivers of Health   Financial Resource Strain: Low Risk  (01/17/2023)   Received from [facility], [facility]   Overall Financial Resource Strain (CARDIA)    Difficulty of Paying Living Expenses: Not very hard  Food Insecurity: No Food Insecurity (01/23/2024)   Hunger Vital Sign    Worried About Running Out of Food in the Last Year: Never true    Ran Out of Food in the Last Year: Never true  Recent Concern: Food Insecurity - Food Insecurity Present (12/23/2023)   Hunger Vital Sign    Worried  About Running Out of Food in the Last Year: Sometimes true    Ran Out of Food in the Last Year: Sometimes true  Transportation Needs: No Transportation Needs (01/23/2024)   PRAPARE - Administrator, Civil Service (Medical): No    Lack of Transportation (Non-Medical): No  Physical Activity: Unknown (01/17/2023)   Received from [facility], [facility]   Exercise Vital Sign    Days of Exercise per Week: 0 days    Minutes of Exercise per Session: Not on file  Stress: Stress Concern Present (01/17/2023)   Received from [facility], [facility] - Occupational Stress Questionnaire    Feeling of Stress : To some extent  Social Connections: Socially Isolated (01/23/2024)   Social Connection and Isolation Panel [NHANES]    Frequency of Communication with Friends and Family: Never    Frequency of Social Gatherings with Friends and Family: Never    Attends Religious Services: Never    Database administrator or Organizations: No    Attends Banker Meetings: Never    Marital Status: Living with partner  Intimate Partner Violence: Not At Risk (01/23/2024)   Humiliation, Afraid, Rape, and Kick questionnaire    Fear of Current or Ex-Partner: No    Emotionally Abused: No  Physically Abused: No    Sexually Abused: No      Gen WDWN  IN NAD CV RRR LUNGS CTAB ABD Gravid soft NT GU trailing membranes per MAU provider EXT no calf tenderness A/P: IUP [redacted]w[redacted]d SPPPROM Admit to antepartum Ampicillin and azithromycin Valtrex for H/O HSV TC MFM consult in AM  Type 2 DM check BS QID with ISS

## 2024-02-03 NOTE — MAU Note (Signed)
.  Tammy [name] is a 33 y.o. at [redacted]w[redacted]d here in MAU reporting having office visit today and the cerclage stitch was ok. Tonight she was using the BR and felt a "bubble pop" and she started bleeding and continues to have vag bleeding. No pain  LMP: n/a Onset of complaint: tonight Pain score: 0 Vitals:   02/03/24 2102 02/03/24 2104  BP:  (!) 144/93  Pulse: (!) 104   Resp: 19   Temp: 98.4 F (36.9 C)   SpO2: 98%      FHT: did not listen  Lab orders placed from triage: none

## 2024-02-04 ENCOUNTER — Encounter (HOSPITAL_COMMUNITY): Payer: Self-pay | Admitting: Obstetrics and Gynecology

## 2024-02-04 ENCOUNTER — Other Ambulatory Visit: Payer: Self-pay

## 2024-02-04 DIAGNOSIS — O42912 Preterm premature rupture of membranes, unspecified as to length of time between rupture and onset of labor, second trimester: Principal | ICD-10-CM

## 2024-02-04 DIAGNOSIS — Z3A17 17 weeks gestation of pregnancy: Secondary | ICD-10-CM | POA: Diagnosis not present

## 2024-02-04 LAB — HEMOGLOBIN A1C
Hgb A1c MFr Bld: 5.8 % — ABNORMAL HIGH (ref 4.8–5.6)
Mean Plasma Glucose: 119.76 mg/dL

## 2024-02-04 LAB — GLUCOSE, CAPILLARY
Glucose-Capillary: 95 mg/dL (ref 70–99)
Glucose-Capillary: 93 mg/dL (ref 70–99)
Glucose-Capillary: 69 mg/dL — ABNORMAL LOW (ref 70–99)
Glucose-Capillary: 119 mg/dL — ABNORMAL HIGH (ref 70–99)

## 2024-02-04 MED ORDER — ASPIRIN 81 MG PO TBEC
81.0000 mg | DELAYED_RELEASE_TABLET | Freq: Every day | ORAL | Status: DC
  Administered 2024-02-04: 81 mg via ORAL
  Filled 2024-02-04: qty 1

## 2024-02-04 MED ORDER — METFORMIN HCL 500 MG PO TABS
500.0000 mg | ORAL_TABLET | Freq: Every day | ORAL | Status: DC
  Administered 2024-02-04 – 2024-02-09 (×5): 500 mg via ORAL
  Filled 2024-02-04 (×6): qty 1

## 2024-02-04 MED ORDER — ESCITALOPRAM OXALATE 10 MG PO TABS
10.0000 mg | ORAL_TABLET | Freq: Every day | ORAL | Status: DC
  Administered 2024-02-04 – 2024-02-10 (×7): 10 mg via ORAL
  Filled 2024-02-04 (×7): qty 1

## 2024-02-04 MED ORDER — POLYETHYLENE GLYCOL 3350 17 G PO PACK
17.0000 g | PACK | Freq: Every day | ORAL | Status: DC
Start: 2024-02-04 — End: 2024-02-07
  Administered 2024-02-04: 17 g via ORAL
  Filled 2024-02-04 (×5): qty 1

## 2024-02-04 MED ORDER — VALACYCLOVIR HCL 500 MG PO TABS
500.0000 mg | ORAL_TABLET | Freq: Two times a day (BID) | ORAL | Status: DC
  Administered 2024-02-04 – 2024-02-10 (×12): 500 mg via ORAL
  Filled 2024-02-04 (×12): qty 1

## 2024-02-04 MED ORDER — SODIUM CHLORIDE 0.9 % IV SOLN
2.0000 g | Freq: Four times a day (QID) | INTRAVENOUS | Status: AC
  Administered 2024-02-04 – 2024-02-05 (×8): 2 g via INTRAVENOUS
  Filled 2024-02-04 (×8): qty 2000

## 2024-02-04 MED ORDER — AMOXICILLIN 500 MG PO CAPS
500.0000 mg | ORAL_CAPSULE | Freq: Three times a day (TID) | ORAL | Status: DC
  Administered 2024-02-06 – 2024-02-07 (×4): 500 mg via ORAL
  Filled 2024-02-04 (×4): qty 1

## 2024-02-04 MED ORDER — AMPHETAMINE-DEXTROAMPHETAMINE 10 MG PO TABS
10.0000 mg | ORAL_TABLET | Freq: Every day | ORAL | Status: DC
  Administered 2024-02-04 – 2024-02-10 (×7): 10 mg via ORAL
  Filled 2024-02-04 (×7): qty 1

## 2024-02-04 MED ORDER — LABETALOL HCL 100 MG PO TABS
100.0000 mg | ORAL_TABLET | Freq: Two times a day (BID) | ORAL | Status: DC
  Administered 2024-02-04 – 2024-02-10 (×13): 100 mg via ORAL
  Filled 2024-02-04 (×13): qty 1

## 2024-02-04 MED ORDER — VALACYCLOVIR HCL 500 MG PO TABS
500.0000 mg | ORAL_TABLET | Freq: Every day | ORAL | Status: DC
  Administered 2024-02-04: 500 mg via ORAL
  Filled 2024-02-04: qty 1

## 2024-02-04 MED ORDER — ESCITALOPRAM OXALATE 10 MG PO TABS: 5.0000 mg | ORAL_TABLET | Freq: Every day | ORAL | Status: DC

## 2024-02-04 MED ORDER — METRONIDAZOLE 500 MG PO TABS
500.0000 mg | ORAL_TABLET | Freq: Two times a day (BID) | ORAL | Status: DC
  Administered 2024-02-04 – 2024-02-07 (×6): 500 mg via ORAL
  Filled 2024-02-04 (×6): qty 1

## 2024-02-04 MED ORDER — INSULIN ASPART 100 UNIT/ML IJ SOLN: 0.0000 [IU] | Freq: Three times a day (TID) | INTRAMUSCULAR | Status: DC

## 2024-02-04 MED ORDER — AZITHROMYCIN 250 MG PO TABS
1000.0000 mg | ORAL_TABLET | Freq: Once | ORAL | Status: AC
  Administered 2024-02-04: 1000 mg via ORAL
  Filled 2024-02-04: qty 4

## 2024-02-04 NOTE — Progress Notes (Signed)
 Patient ID: Tammy [name], female   DOB: Dec 15, 1991, 33 y.o.   MRN: [id number]  Tammy [name] is a 33 y.o. G4P0030 at [redacted]w[redacted]d admitted for PPROM with in-situ rescue cerclage.  Hospital Day No: 1  Subjective: Reports blood tinged fluid.  Objective: BP 125/64 (BP Location: Left Arm)   Pulse 86   Temp 97.9 F (36.6 C) (Oral)   Resp 18   Ht 5\' 8"  (1.727 m)   Wt 106.6 kg   LMP 10/07/2023   SpO2 100%   BMI 35.73 kg/m  No intake/output data recorded. No intake/output data recorded.  Physical Exam:  Gen: alert and no distress Chest/Lungs: unlabored  Heart/Pulse: RRR  Abdomen: soft, gravid, nontender, BX x4 quad Uterine fundus: soft, nontender Skin & Color: warm and dry  Neurological: AOx3 EXT: negative Homan's b/l, edema and SCDs are on  FHT:   140 UC:   none SVE:    deferred  Labs: Lab Results  Component Value Date   WBC 9.0 02/03/2024   HGB 10.3 (L) 02/03/2024   HCT 32.0 (L) 02/03/2024   MCV 82.5 02/03/2024   PLT 331 02/03/2024    Assessment and Plan: has Chronic hypertension affecting pregnancy; Premature rupture of membranes; IUFD at less than 20 weeks of gestation; Lab test positive for detection of COVID-19 virus; Acute blood loss anemia (ABLA); DM (diabetes mellitus) in pregnancy; Obesity affecting pregnancy, antepartum; 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; and Preterm premature rupture of membranes (PPROM) delivered, current hospitalization on their problem list.  FHTs q shift Routine antenatal care Plan as per MFM recommendation...  MFM Recommendations (Dr. Darra Lis) -Continue inpatient care until the patient is stable and clinically improving (I.e. no bleeding, minimal contractions, normal vital signs).  Once she is stable she can be discharged and admission can be considered around 22 to 23 weeks. However, the patient has expressed the desire to remain inpatient so she can stay in Trendelenburg position.  I discussed that I did  not recommend this as there is no data to suggest this helped significantly harm but this is ultimately her decision. -SCDs are encouraged since she will be immobile for a period of time.  If she is stable and there is no signs of labor or infection then Lovenox can be considered daily to prevent thromboembolism.  -Weekly ultrasound while she is admitted to assess the amniotic fluid level. -Fetal growth ultrasounds to be done every 2 to 3 weeks. -A fetal ultrasound to assess biometry should be done around 22 weeks to assess if the fetus is at or above 400 to 500 g to see if resuscitation is possible.   -Latency antibiotics can be continued while inpatient for 1 week.  The patient would like a longer course but I discussed there is no data to suggest this.  However, if her OB provider would like to consider this it is not unreasonable as long as she does not have an adverse reaction to the antibiotics.  If she discontinues antibiotics after she completes her weeklong course then it is worth consider restarting a course of latency antibiotics around 22 to 23 weeks. -Betamethasone 12 mg 24 hours apart x 2 doses can be given as early as 22 weeks for fetal lung maturity -Magnesium sulfate can be given as early as 22 and continued through the steroid window (48 hours) to reduce the risk of cerebral palsy.  It should be restarted if there is concern for delivery after 22 weeks for at least 6  hours proceed with delivery if possible. -Delivery should occur if there is concern for intra amniotic infection or labor. -A short course of tocolytics can be given for intermittent contractions. -I recommended considering removal of the cerclage given the poor prognosis and possible for cervical trauma in the event she were to go into labor.  Patient desired to keep the cerclage as long as there is no signs of intra amniotic infection or significant bleeding.  Strongly recommend removing the cerclage if there is concern for  significant bleeding or infection.     -Disposition: continue inpatient care  Purcell Nails 02/04/2024, 12:47 PM

## 2024-02-04 NOTE — Consult Note (Signed)
 MFM Inpatient Consult Note Patient Name: Tammy [name]  Patient MRN:   [id number]  Referring provider: Dr. Su Hilt  Reason for Consult: PPROM at 17 weeks in the setting of PPROM.    HPI: Tammy [name] is a 33 y.o. G4P0030 at [redacted]w[redacted]d admitted for preterm premature rupture membranes.  She is status post cerclage placement for cervical funneling on 01/23/2024.  The patient reports that she has had concern that her membranes were prolapsing beyond her cervix but she was uncertain to what she was feeling vaginally.  I discussed that this is likely her membranes prolapsing through the cervix.  She tried to call the MAU but was unsuccessful on multiple attempts.  She eventually came in and was diagnosed with early P PROM.  She also had a significant amount of vaginal bleeding that seems to have stopped.  We discussed the importance of considering removing the cerclage due to vaginal bleeding.  Right now she does not want the cerclage removed she wants to try to get as far as possible the pregnancy.  She also expressed a desire to stay in Trendelenburg the entire time as long as it may help.  I discussed that there is no data to suggest that long-term bedrest agrees helpful in this clinical scenario. There are also risks to bedrest including deconditioning, blood clots and emotional distress from prolonged isolation in the hospital and lack of physical exercise.  She reports that she has been constipated for the past 3 days.  We discussed the importance of a bowel regimen and possibly an enema.  Right now she desires to stay inpatient the entire time.  She also expressed the desire to continue antibiotics beyond typical week.  I discussed that there is no evidence to suggest this to be typically stop after 1 week and then restart another weeks worth of antibiotics when the patient is readmitted around 22 weeks.  I discussed the diagnosis, management and clinical significance of very early previable preterm premature  rupture membranes in the setting of cervical insufficiency with a cerclage.  We discussed the very poor prognosis due to the high chance of pulmonary hypoplasia in the fetus.  We discussed that it is impossible to predict which fetus will do well after delivery but in general the earlier onset of rupture membranes especially less than 22 weeks, the worst the prognosis.  We discussed the various contraindications to continue the pregnancy such as an intra amniotic infection.  At this time she feels well overall but is emotionally discouraged due to the clinical scenario.  I discussed the role of removing the cerclage to prevent cervical laceration in the event she were to go into labor.  We also discussed the utility of inpatient versus outpatient management.  We discussed the role of betamethasone and magnesium.    At this time she is agreeable to the plan outlined below.  She had time to ask questions were also answered to her satisfaction.  Review of Systems: A review of systems was performed and was negative except per HPI   Past Obstetrical History:  OB History  Gravida Para Term Preterm AB Living  4    3   SAB IAB Ectopic Multiple Live Births  3        # Outcome Date GA Lbr Len/2nd Weight Sex Type Anes PTL Lv  4 Current           3 SAB 07/17/23 [redacted]w[redacted]d    SAB   FD  2 SAB  1 SAB              Past Gynecologic History:  Not discussed  Past Medical History:  Past Medical History:  Diagnosis Date   ADHD    Anxiety    Depression    Diabetes mellitus without complication (HCC)    HSV infection    PCOS (polycystic ovarian syndrome)       Past Surgical History:    Past Surgical History:  Procedure Laterality Date   CERVICAL CERCLAGE Bilateral 01/23/2024   Procedure: CERCLAGE CERVICAL;  Surgeon: Osborn Coho, MD;  Location: [facility] LD ORS;  Service: Gynecology;  Laterality: Bilateral;   SHOULDER SURGERY       Family History:   family history is not on file.   Social  History:   Social History   Socioeconomic History   Marital status: Single    Spouse name: Not on file   Number of children: Not on file   Years of education: Not on file   Highest education level: Not on file  Occupational History   Not on file  Tobacco Use   Smoking status: Former    Types: Cigarettes   Smokeless tobacco: Never  Vaping Use   Vaping status: Former  Substance and Sexual Activity   Alcohol use: Not Currently    Comment: weekly   Drug use: Not Currently   Sexual activity: Yes    Birth control/protection: None  Other Topics Concern   Not on file  Social History Narrative   Not on file   Social Drivers of Health   Financial Resource Strain: Low Risk  (01/17/2023)   Received from [facility], [facility]   Overall Financial Resource Strain (CARDIA)    Difficulty of Paying Living Expenses: Not very hard  Food Insecurity: No Food Insecurity (02/04/2024)   Hunger Vital Sign    Worried About Running Out of Food in the Last Year: Never true    Ran Out of Food in the Last Year: Never true  Recent Concern: Food Insecurity - Food Insecurity Present (12/23/2023)   Hunger Vital Sign    Worried About Running Out of Food in the Last Year: Sometimes true    Ran Out of Food in the Last Year: Sometimes true  Transportation Needs: No Transportation Needs (02/04/2024)   PRAPARE - Administrator, Civil Service (Medical): No    Lack of Transportation (Non-Medical): No  Physical Activity: Unknown (01/17/2023)   Received from [facility], [facility]   Exercise Vital Sign    Days of Exercise per Week: 0 days    Minutes of Exercise per Session: Not on file  Stress: Stress Concern Present (01/17/2023)   Received from [facility], [facility] of Occupational Health - Occupational Stress Questionnaire    Feeling of Stress : To some extent  Social Connections: Socially Isolated (01/23/2024)   Social Connection and Isolation Panel [NHANES]     Frequency of Communication with Friends and Family: Never    Frequency of Social Gatherings with Friends and Family: Never    Attends Religious Services: Never    Database administrator or Organizations: No    Attends Banker Meetings: Never    Marital Status: Living with partner  Intimate Partner Violence: Not At Risk (02/04/2024)   Humiliation, Afraid, Rape, and Kick questionnaire    Fear of Current or Ex-Partner: No    Emotionally Abused: No    Physically Abused: No  Sexually Abused: No      Home Medications:   No current facility-administered medications on file prior to encounter.   Current Outpatient Medications on File Prior to Encounter  Medication Sig Dispense Refill   amphetamine-dextroamphetamine (ADDERALL XR) 10 MG 24 hr capsule Take 10 mg by mouth daily.     aspirin EC 81 MG tablet Take 81 mg by mouth daily. Swallow whole.     escitalopram (LEXAPRO) 5 MG tablet Take 10 mg by mouth daily.     labetalol (NORMODYNE) 100 MG tablet Take 100 mg by mouth 2 (two) times daily.     metFORMIN (GLUCOPHAGE) 500 MG tablet Take 500 mg by mouth. At night     cholecalciferol (VITAMIN D3) 25 MCG (1000 UNIT) tablet Take 1,000 Units by mouth daily.     Prenatal Vit-Fe Fumarate-FA (PRENATAL MULTIVITAMIN) TABS tablet Take 1 tablet by mouth daily at 12 noon.        Allergies:   No Known Allergies   Physical Exam:   Vitals:   02/04/24 0604 02/04/24 0728  BP: 127/73 125/64  Pulse: 81 86  Resp: 18 18  Temp: 98.6 F (37 C) 97.9 F (36.6 C)  SpO2: 98% 100%   Sitting comfortably on the hospital bed  Nonlabored breathing Normal rate and rhythm Abdomen is nontender  Sonographic findings Single intrauterine pregnancy at 17w 0d.  Fetal cardiac activity:  Observed and appears normal. Presentation: Breech. Interval fetal anatomy appears normal but is very limited due to low fluid.  Fetal biometry shows the estimated fetal weight at the 26 percentile. Amniotic fluid  volume: Anhydramnios.  Placenta: Posterior.  Assessment  Tammy [name] is a 33 y.o. G4P0030 at [redacted]w[redacted]d admitted for the following:  Preterm premature rupture of membranes (PPROM) with unknown onset of labor  [redacted] weeks gestation of pregnancy  Cervical cerclage suture present in second trimester  Recommendations -Continue inpatient care until the patient is stable and clinically improving (I.e. no bleeding, minimal contractions, normal vital signs).  Once she is stable she can be discharged and admission can be considered around 22 to 23 weeks. However, the patient has expressed the desire to remain inpatient so she can stay in Trendelenburg position.  I discussed that I did not recommend this as there is no data to suggest this helped significantly harm but this is ultimately her decision. -SCDs are encouraged since she will be immobile for a period of time.  If she is stable and there is no signs of labor or infection then Lovenox can be considered daily to prevent thromboembolism.  -Weekly ultrasound while she is admitted to assess the amniotic fluid level. -Fetal growth ultrasounds to be done every 2 to 3 weeks. -A fetal ultrasound to assess biometry should be done around 22 weeks to assess if the fetus is at or above 400 to 500 g to see if resuscitation is possible.   -Latency antibiotics can be continued while inpatient for 1 week.  The patient would like a longer course but I discussed there is no data to suggest this.  However, if her OB provider would like to consider this it is not unreasonable as long as she does not have an adverse reaction to the antibiotics.  If she discontinues antibiotics after she completes her weeklong course then it is worth consider restarting a course of latency antibiotics around 22 to 23 weeks. -Betamethasone 12 mg 24 hours apart x 2 doses can be given as early as 22 weeks for fetal lung  maturity -Magnesium sulfate can be given as early as 22 and continued  through the steroid window (48 hours) to reduce the risk of cerebral palsy.  It should be restarted if there is concern for delivery after 22 weeks for at least 6 hours proceed with delivery if possible. -Delivery should occur if there is concern for intra amniotic infection or labor. -A short course of tocolytics can be given for intermittent contractions. -I recommended considering removal of the cerclage given the poor prognosis and possible for cervical trauma in the event she were to go into labor.  Patient desired to keep the cerclage as long as there is no signs of intra amniotic infection or significant bleeding.  Strongly recommend removing the cerclage if there is concern for significant bleeding or infection.     -Disposition: continue inpatient care  Approximately 70 minutes was performed in consultation including chart review, documentation and communication with the referring provider. Thank you for the opportunity to be involved with this patient's care. Please let us know if we can be of any further assistance.   Braxton Feathers, DO MFM, [facility]   02/04/2024  10:47 AM

## 2024-02-05 ENCOUNTER — Ambulatory Visit: Payer: Medicaid Other

## 2024-02-05 ENCOUNTER — Other Ambulatory Visit: Payer: Medicaid Other

## 2024-02-05 ENCOUNTER — Encounter (HOSPITAL_COMMUNITY): Payer: Self-pay | Admitting: Obstetrics and Gynecology

## 2024-02-05 DIAGNOSIS — N760 Acute vaginitis: Secondary | ICD-10-CM | POA: Diagnosis present

## 2024-02-05 DIAGNOSIS — N883 Incompetence of cervix uteri: Secondary | ICD-10-CM | POA: Diagnosis present

## 2024-02-05 DIAGNOSIS — B9689 Other specified bacterial agents as the cause of diseases classified elsewhere: Secondary | ICD-10-CM | POA: Diagnosis present

## 2024-02-05 DIAGNOSIS — R768 Other specified abnormal immunological findings in serum: Secondary | ICD-10-CM | POA: Diagnosis present

## 2024-02-05 LAB — GLUCOSE, CAPILLARY
Glucose-Capillary: 72 mg/dL (ref 70–99)
Glucose-Capillary: 110 mg/dL — ABNORMAL HIGH (ref 70–99)
Glucose-Capillary: 118 mg/dL — ABNORMAL HIGH (ref 70–99)
Glucose-Capillary: 104 mg/dL — ABNORMAL HIGH (ref 70–99)

## 2024-02-05 MED ORDER — ACETAMINOPHEN-CAFFEINE 500-65 MG PO TABS
2.0000 | ORAL_TABLET | Freq: Once | ORAL | Status: AC
  Administered 2024-02-05: 2 via ORAL
  Filled 2024-02-05 (×2): qty 2

## 2024-02-05 NOTE — Plan of Care (Signed)

## 2024-02-05 NOTE — Progress Notes (Signed)
   S: Called by Dr Su Hilt, pt called on call line to discussed her feeling and ready for the removal of cerclage, pt discussed she had come to terms with the fact that this was her body trying to miscarry, In room to assess per DR Su Hilt order, pt stable in NAD and on a phone call with her partner. Pt in no pain, just mild HA which she will call the nurse for.     O: BP 126/65 (BP Location: Left Arm)   Pulse 80   Temp (!) 97.5 F (36.4 C) (Oral)   Resp 18   Ht 5\' 8"  (1.727 m)   Wt 106.6 kg   LMP 10/07/2023   SpO2 100%   BMI 35.73 kg/m   Dime size red spotting x2 over last 5 hours. Small amount noted, no active bleeding noted.   A/P: scant bleeding noted on pad, has not been changed for 5 hours, bleeding stable, npo signs of infection, per Dr Su Hilt if bleeding minimal can plan for cerclage in morning, will report to Dr Normand Sloop in the morning to get this scheduled.

## 2024-02-05 NOTE — Procedures (Signed)
 Pt put on bed pan to elevate the pelvis Sterile speculum placed The cerclage was grasped with a ring forcep and removed at the base using scissors All instruments were sterile Cx FT / thick/high\  Pt told all options and desires observation for now

## 2024-02-05 NOTE — Progress Notes (Addendum)
 Hospital day # 2 pregnancy at [redacted]w[redacted]d--PPROM on 2/18 at 17 weeks.  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   Preterm premature rupture of membranes (PPROM) delivered, current hospitalization 02/03/2024   DM (diabetes mellitus) (HCC) 12/25/2023   Obesity affecting pregnancy, antepartum 12/25/2023   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 12/25/2023   Lab test positive for detection of COVID-19 virus 07/19/2023   Acute blood loss anemia (ABLA) 07/19/2023   Premature rupture of membranes 07/17/2023   IUFD at less than 20 weeks of gestation 07/17/2023   Chronic hypertension affecting pregnancy 08/25/2022     Active Ambulatory Problems    Diagnosis Date Noted   Chronic hypertension affecting pregnancy 08/25/2022   Premature rupture of membranes 07/17/2023   IUFD at less than 20 weeks of gestation 07/17/2023   Lab test positive for detection of COVID-19 virus 07/19/2023   Acute blood loss anemia (ABLA) 07/19/2023   DM (diabetes mellitus) (HCC) 12/25/2023   Obesity affecting pregnancy, antepartum 12/25/2023   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 12/25/2023   Resolved Ambulatory Problems    Diagnosis Date Noted   No Resolved Ambulatory Problems   Past Medical History:  Diagnosis Date   ADHD    Anxiety    Depression    Diabetes mellitus without complication (HCC)    HSV infection    PCOS (polycystic ovarian syndrome)      S:  Pt was resting bed in NAD when I entered the room, had a lengthy discussion about POC, and different option, including, doing nothing and R/B/A of infection and bleeding, as well as removal of cerclage with expectant management versus removal of cerclage with Cytotec, discussion emotion and defense mechanism behind each options, pt leaning toward removal of cerclage and then expectant  management, pt verbalized she is aware of risk of getting infection or bleeding increase and does not want that to happen, pt wil continue to discuses with partner and dr Normand Sloop will reassess topic at 32. Mood is appropriate for situation and pt handling emotions well. Currently in denial phase per pt.        Perception of contractions: none, irregular, mild       Vaginal bleeding: none now, brown, spotting, and lighter than period       Vaginal discharge:  watery and no significant change  O: BP 132/67 (BP Location: Right Arm)   Pulse 80   Temp 97.6 F (36.4 C) (Oral)   Resp 17   Ht 5\' 8"  (1.727 m)   Wt 106.6 kg   LMP 10/07/2023   SpO2 100%   BMI 35.73 kg/m       Fetal tracings: Doppler 150      Contractions:   mild noted per pt.       Uterus gravid and non-tender      Extremities: extremities normal, atraumatic, no cyanosis or edema and no significant edema and no signs of DVT          Labs:  WBC 9.0  CBG (last 3)  Recent Labs    02/04/24 1937 02/04/24 2131 02/05/24 0825  GLUCAP 69* 119* 72    A: [redacted]w[redacted]d with [name] is [birth year]  Patient's last menstrual period was 10/07/2023. [redacted]w[redacted]d  Pt presents with Preterm premature rupture of membranes (PPROM) delivered, current hospitalization [O42.919]. Pregnancy complicated by Incompetent cervix.  Pt has a cerclage, Type 2 DM, CHTN, HSV, Obesity, BV, GAD, MDD, ADHD.       unchanged  P: Continue current plan of care      Incompetent cervix with PPROM: cerclage stil in place per pt desires, Scan bleeding noted over night, afebrile, continues latency abx, pt had decided to remove her cerclage and plans to do this this afternoon. 2/19 US showed anhydramnios, breech, posterior placenta, efw 6oz, 26%.   HSV: continue valtrex 500mg  BID and monitor for new lesions, currently no lesions or prodromal s/sx.   Obesity: SDC, Lovenox if bleeding stable and long term stay indicated.   GAD/MDD/ADHD: continue adderal XR 10mg  once  daily  CHTN: continue labetalol 100mg  BID and monitor BP, currently stable asymptomatic, BP 132/67.   DM2: continue fasting CBG and 2 H PP, insulin sliding scale PRN, continue metformin 500mg  at dinner, currently CBG WNL. HgA1C 5.8.   BV: continue for three days of flagyl, today number 1.5. D/C on 2/22 for full course.        MDs will follow, Dr Normand Sloop aware and up to date.   [facility] CNM, FNP-C, PMHNP-BC  [id number]  [place], [place] [address]  Cell: [phone]  Office Phone: [phone] Fax: [phone] 02/05/2024  11:58 AM

## 2024-02-06 LAB — CBC WITH DIFFERENTIAL/PLATELET
Abs Immature Granulocytes: 0.04 K/uL (ref 0.00–0.07)
Basophils Absolute: 0 K/uL (ref 0.0–0.1)
Basophils Relative: 0 %
Eosinophils Absolute: 0.3 K/uL (ref 0.0–0.5)
Eosinophils Relative: 3 %
HCT: 31.1 % — ABNORMAL LOW (ref 36.0–46.0)
Hemoglobin: 10.4 g/dL — ABNORMAL LOW (ref 12.0–15.0)
Immature Granulocytes: 0 %
Lymphocytes Relative: 38 %
Lymphs Abs: 3.8 K/uL (ref 0.7–4.0)
MCH: 27.2 pg (ref 26.0–34.0)
MCHC: 33.4 g/dL (ref 30.0–36.0)
MCV: 81.4 fL (ref 80.0–100.0)
Monocytes Absolute: 0.6 K/uL (ref 0.1–1.0)
Monocytes Relative: 6 %
Neutro Abs: 5.3 K/uL (ref 1.7–7.7)
Neutrophils Relative %: 53 %
Platelets: 316 K/uL (ref 150–400)
RBC: 3.82 MIL/uL — ABNORMAL LOW (ref 3.87–5.11)
RDW: 14 % (ref 11.5–15.5)
WBC: 10.1 K/uL (ref 4.0–10.5)
nRBC: 0 % (ref 0.0–0.2)

## 2024-02-06 LAB — GLUCOSE, CAPILLARY
Glucose-Capillary: 85 mg/dL (ref 70–99)
Glucose-Capillary: 110 mg/dL — ABNORMAL HIGH (ref 70–99)
Glucose-Capillary: 116 mg/dL — ABNORMAL HIGH (ref 70–99)
Glucose-Capillary: 109 mg/dL — ABNORMAL HIGH (ref 70–99)

## 2024-02-06 NOTE — Progress Notes (Addendum)
 Subjective:    Denies feeling pain, abd tenderness, vaginal bleeding, and leaking fluid. Does not perceive contractions, previously c/o "tightening" overnight, bet denies any cramping, pain, and contractions. Pt reports considering options for the pregnancy and desires to end the pregnancy. This is a change from her previous request to use all measures to maintain the pregnancy until viability. Will discuss pt's request with Dr. Su Hilt.   Objective:    VS: BP 125/83 (BP Location: Left Arm)   Pulse 86   Temp 98.4 F (36.9 C) (Oral)   Resp 16   Ht 5\' 8"  (1.727 m)   Wt 106.6 kg   LMP 10/07/2023   SpO2 98%   BMI 35.73 kg/m  FHR : doppler 164 Uterine activity: none Membranes: PPROM x 72 hrs VE deferred, last VE by Dr. Normand Sloop on 02/05/24  Dilation: Fingertip Exam by:: Dr Normand Sloop Gen: AAO x3 Cardia: RRR, no edema Resp: clear, unlabored Abd: soft, non-tender Extre: neg for pain, tenderness, and cords  Assessment/Plan:   33 y.o. [id number] [redacted]w[redacted]d PPROM  S/P cerclage for cervical insufficiency    -cerclage removed 02/05/24 by Dr. Normand Sloop    -no signs of labor since removal Desires termination Pt's request discussed with Dr. Evorn Gong DNP, CNM 02/06/2024 4:31 PM

## 2024-02-07 ENCOUNTER — Inpatient Hospital Stay (HOSPITAL_COMMUNITY): Payer: Medicaid Other | Admitting: Anesthesiology

## 2024-02-07 ENCOUNTER — Encounter (HOSPITAL_COMMUNITY)
Admission: AD | Disposition: A | Discharge status: Home / Self Care | Payer: Self-pay | Source: Home / Self Care | Attending: Obstetrics and Gynecology

## 2024-02-07 ENCOUNTER — Encounter (HOSPITAL_COMMUNITY): Payer: Self-pay | Admitting: Obstetrics and Gynecology

## 2024-02-07 DIAGNOSIS — Z3A00 Weeks of gestation of pregnancy not specified: Secondary | ICD-10-CM

## 2024-02-07 HISTORY — PX: DILATION AND EVACUATION: SHX1459

## 2024-02-07 LAB — GLUCOSE, CAPILLARY
Glucose-Capillary: 99 mg/dL (ref 70–99)
Glucose-Capillary: 90 mg/dL (ref 70–99)
Glucose-Capillary: 52 mg/dL — ABNORMAL LOW (ref 70–99)
Glucose-Capillary: 55 mg/dL — ABNORMAL LOW (ref 70–99)
Glucose-Capillary: 57 mg/dL — ABNORMAL LOW (ref 70–99)
Glucose-Capillary: 76 mg/dL (ref 70–99)

## 2024-02-07 SURGERY — DILATION AND EVACUATION, UTERUS
Anesthesia: General

## 2024-02-07 MED ORDER — FENTANYL CITRATE (PF) 100 MCG/2ML IJ SOLN: 25.0000 ug | INTRAMUSCULAR | Status: DC | PRN

## 2024-02-07 MED ORDER — MISOPROSTOL 200 MCG PO TABS: 200.0000 ug | ORAL_TABLET | Freq: Once | ORAL | Status: DC

## 2024-02-07 MED ORDER — IBUPROFEN 600 MG PO TABS
600.0000 mg | ORAL_TABLET | Freq: Four times a day (QID) | ORAL | Status: DC | PRN
  Administered 2024-02-07: 600 mg via ORAL
  Filled 2024-02-07: qty 1

## 2024-02-07 MED ORDER — KETOROLAC TROMETHAMINE 30 MG/ML IJ SOLN
30.0000 mg | Freq: Four times a day (QID) | INTRAMUSCULAR | Status: DC
  Administered 2024-02-08 (×2): 30 mg via INTRAVENOUS
  Filled 2024-02-07 (×2): qty 1

## 2024-02-07 MED ORDER — LACTATED RINGERS IV SOLN: INTRAVENOUS | Status: AC

## 2024-02-07 MED ORDER — MISOPROSTOL 200 MCG PO TABS
ORAL_TABLET | ORAL | Status: AC
  Administered 2024-02-07: 200 ug
  Filled 2024-02-07: qty 1

## 2024-02-07 MED ORDER — AMISULPRIDE (ANTIEMETIC) 5 MG/2ML IV SOLN: 10.0000 mg | Freq: Once | INTRAVENOUS | Status: DC | PRN

## 2024-02-07 MED ORDER — SODIUM CHLORIDE 0.9 % IV SOLN: 12.5000 mg | INTRAVENOUS | Status: DC | PRN

## 2024-02-07 MED ORDER — PROPOFOL 500 MG/50ML IV EMUL
INTRAVENOUS | Status: DC | PRN
  Administered 2024-02-07: 100 ug/kg/min via INTRAVENOUS

## 2024-02-07 MED ORDER — OXYCODONE HCL 5 MG PO TABS
5.0000 mg | ORAL_TABLET | ORAL | Status: DC | PRN
  Administered 2024-02-07: 10 mg via ORAL
  Administered 2024-02-08 (×4): 5 mg via ORAL
  Administered 2024-02-08 – 2024-02-09 (×3): 10 mg via ORAL
  Administered 2024-02-09: 5 mg via ORAL
  Administered 2024-02-10 (×2): 10 mg via ORAL
  Filled 2024-02-07 (×2): qty 2
  Filled 2024-02-07: qty 1
  Filled 2024-02-07: qty 2
  Filled 2024-02-07: qty 1
  Filled 2024-02-07 (×4): qty 2
  Filled 2024-02-07: qty 1
  Filled 2024-02-07: qty 2

## 2024-02-07 MED ORDER — METOCLOPRAMIDE HCL 5 MG/ML IJ SOLN
INTRAMUSCULAR | Status: DC | PRN
  Administered 2024-02-07: 10 mg via INTRAVENOUS

## 2024-02-07 MED ORDER — FENTANYL CITRATE (PF) 100 MCG/2ML IJ SOLN
100.0000 ug | Freq: Once | INTRAMUSCULAR | Status: AC
  Administered 2024-02-07: 100 ug via INTRAVENOUS

## 2024-02-07 MED ORDER — ACETAMINOPHEN 325 MG PO TABS
650.0000 mg | ORAL_TABLET | ORAL | Status: DC | PRN
  Administered 2024-02-07 – 2024-02-09 (×3): 650 mg via ORAL
  Filled 2024-02-07 (×4): qty 2

## 2024-02-07 MED ORDER — OXYCODONE HCL 5 MG PO TABS: 5.0000 mg | ORAL_TABLET | Freq: Once | ORAL | Status: DC | PRN

## 2024-02-07 MED ORDER — ONDANSETRON HCL 4 MG/2ML IJ SOLN
INTRAMUSCULAR | Status: DC | PRN
  Administered 2024-02-07: 4 mg via INTRAVENOUS

## 2024-02-07 MED ORDER — SODIUM CHLORIDE 0.9 % IV SOLN
100.0000 mg | Freq: Once | INTRAVENOUS | Status: AC
  Administered 2024-02-07: 100 mg via INTRAVENOUS

## 2024-02-07 MED ORDER — DEXTROSE 50 % IV SOLN
INTRAVENOUS | Status: AC
Start: 2024-02-07 — End: ?
  Filled 2024-02-07: qty 50

## 2024-02-07 MED ORDER — MISOPROSTOL 200 MCG PO TABS
ORAL_TABLET | ORAL | Status: AC
  Filled 2024-02-07: qty 4

## 2024-02-07 MED ORDER — DEXTROSE 50 % IV SOLN
25.0000 mL | Freq: Once | INTRAVENOUS | Status: AC
  Administered 2024-02-07: 25 mL via INTRAVENOUS

## 2024-02-07 MED ORDER — FENTANYL CITRATE (PF) 100 MCG/2ML IJ SOLN
INTRAMUSCULAR | Status: AC
  Filled 2024-02-07: qty 2

## 2024-02-07 MED ORDER — OXYTOCIN-SODIUM CHLORIDE 30-0.9 UT/500ML-% IV SOLN
INTRAVENOUS | Status: AC
  Filled 2024-02-07: qty 500

## 2024-02-07 MED ORDER — DEXMEDETOMIDINE HCL IN NACL 80 MCG/20ML IV SOLN
INTRAVENOUS | Status: DC | PRN
  Administered 2024-02-07 (×2): 10 ug via INTRAVENOUS

## 2024-02-07 MED ORDER — FENTANYL CITRATE (PF) 100 MCG/2ML IJ SOLN
100.0000 ug | Freq: Once | INTRAMUSCULAR | Status: AC
  Administered 2024-02-07: 100 ug via INTRAVENOUS
  Filled 2024-02-07: qty 2

## 2024-02-07 MED ORDER — STERILE WATER FOR IRRIGATION IR SOLN
Status: DC | PRN
  Administered 2024-02-07: 1

## 2024-02-07 MED ORDER — OXYCODONE HCL 5 MG/5ML PO SOLN: 5.0000 mg | Freq: Once | ORAL | Status: DC | PRN

## 2024-02-07 MED ORDER — SODIUM CHLORIDE 0.9 % IR SOLN
Status: DC | PRN
  Administered 2024-02-07: 1

## 2024-02-07 MED ORDER — IBUPROFEN 600 MG PO TABS: 600.0000 mg | ORAL_TABLET | Freq: Four times a day (QID) | ORAL | Status: DC

## 2024-02-07 MED ORDER — MIDAZOLAM HCL 2 MG/2ML IJ SOLN
INTRAMUSCULAR | Status: AC
  Filled 2024-02-07: qty 2

## 2024-02-07 MED ORDER — LACTATED RINGERS IV SOLN
INTRAVENOUS | Status: DC | PRN
Start: 2024-02-07 — End: 2024-02-07

## 2024-02-07 MED ORDER — FENTANYL CITRATE (PF) 100 MCG/2ML IJ SOLN
INTRAMUSCULAR | Status: DC | PRN
  Administered 2024-02-07 (×2): 50 ug via INTRAVENOUS

## 2024-02-07 MED ORDER — LIDOCAINE HCL (CARDIAC) PF 100 MG/5ML IV SOSY
PREFILLED_SYRINGE | INTRAVENOUS | Status: DC | PRN
  Administered 2024-02-07: 50 mg via INTRAVENOUS

## 2024-02-07 MED ORDER — MIDAZOLAM HCL 2 MG/2ML IJ SOLN
INTRAMUSCULAR | Status: DC | PRN
  Administered 2024-02-07: 2 mg via INTRAVENOUS

## 2024-02-07 SURGICAL SUPPLY — 18 items
CATH ROBINSON RED A/P 16FR (CATHETERS) ×1 IMPLANT
CLOTH BEACON ORANGE TIMEOUT ST (SAFETY) ×1 IMPLANT
DECANTER SPIKE VIAL GLASS SM (MISCELLANEOUS) ×1 IMPLANT
GLOVE BIO SURGEON STRL SZ7.5 (GLOVE) ×1 IMPLANT
GLOVE BIOGEL PI IND STRL 7.0 (GLOVE) ×1 IMPLANT
GLOVE BIOGEL PI IND STRL 7.5 (GLOVE) ×2 IMPLANT
GOWN STRL REUS W/TWL LRG LVL3 (GOWN DISPOSABLE) ×2 IMPLANT
KIT BERKELEY 1ST TRIMESTER 3/8 (MISCELLANEOUS) ×1 IMPLANT
NS IRRIG 1000ML POUR BTL (IV SOLUTION) ×1 IMPLANT
PACK VAGINAL MINOR WOMEN LF (CUSTOM PROCEDURE TRAY) ×1 IMPLANT
PAD OB MATERNITY 4.3X12.25 (PERSONAL CARE ITEMS) ×1 IMPLANT
PAD PREP 24X48 CUFFED NSTRL (MISCELLANEOUS) ×1 IMPLANT
SET BERKELEY SUCTION TUBING (SUCTIONS) ×1 IMPLANT
TOWEL OR 17X24 6PK STRL BLUE (TOWEL DISPOSABLE) ×2 IMPLANT
VACURETTE 10 RIGID CVD (CANNULA) IMPLANT
VACURETTE 7MM CVD STRL WRAP (CANNULA) IMPLANT
VACURETTE 8 RIGID CVD (CANNULA) IMPLANT
VACURETTE 9 RIGID CVD (CANNULA) IMPLANT

## 2024-02-07 NOTE — Progress Notes (Signed)
 Informed consent for termination reviewed in detail and options discussed. Questions answered and pt states that she is certain that she desires to end the pregnancy and does not wish to continue rescue measures. According to the consent, the pt will have 72 hours to consider all options as detailed in the consent. Pt is aware of the 72 requirement.  No signs of infection or maternal distress present. Normal physical exam. Current plan to be continued and adjusted as deemed by changes in maternal/fetal assessment.  Dr. Su Hilt informed and MD will develop a plan with the pt and the necessary parties.

## 2024-02-07 NOTE — Op Note (Signed)
 Preop Diagnosis: Retained Placenta  Postop Diagnosis: Retained Placenta  Procedure: Suction Dilation and Curettage   Anesthesia: Sedation  Anesthesiologist: Leslye Peer, MD   Attending: Osborn Coho, MD   Assistant: N/a  Findings: Normal appearing placenta  Pathology: 1.Placenta 2.POCs (products from the suction)  Fluids: 1000 cc  UOP: 150 cc  EBL: 20cc  Complications: None  Procedure: The patient was taken to the operating room after the risks benefits and alternatives were discussed with the patient, the patient verbalized understanding and consent signed and witnessed.  The patient was placed under MAC anesthesia, prepped and draped in the normal sterile fashion and a time out was performed.  A bivalve speculum was placed in the patient's vagina and the anterior lip of the cervix grasped with a single-tooth tenaculum.  The placenta was grasped with a ringed forcep and placenta removed.  The uterus was sounded to 9 cm after removing the bulk of the placenta with the ringed forceps and a size 10cm (asked for 8 only 7 and 10 available) suction curette was used. Suction curettage was performed until minimal tissue returned. The uterus sounded to 15cm at this point.  Sharp curettage was performed until a gritty texture was noted. Suction curettage was performed once again to remove any remaining debris. All instruments were removed. The count was correct. The patient was transferred to the recovery room in good condition.

## 2024-02-07 NOTE — Transfer of Care (Signed)
 Immediate Anesthesia Transfer of Care Note  Patient: Tammy [name]  Procedure(s) Performed: DILATATION AND EVACUATION  Patient Location: PACU  Anesthesia Type:MAC  Level of Consciousness: awake, alert , and oriented  Airway & Oxygen Therapy: Patient Spontanous Breathing  Post-op Assessment: Report given to RN and Post -op Vital signs reviewed and stable  Post vital signs: Reviewed and stable  Last Vitals:  Vitals Value Taken Time  BP    Temp    Pulse    Resp    SpO2      Last Pain:  Vitals:   02/07/24 1716  TempSrc: Oral  PainSc:       Patients Stated Pain Goal: 1 (02/07/24 1428)  Complications: No notable events documented.

## 2024-02-07 NOTE — Anesthesia Postprocedure Evaluation (Signed)
 Anesthesia Post Note  Patient: Tammy [name]  Procedure(s) Performed: DILATATION AND EVACUATION     Patient location during evaluation: PACU Anesthesia Type: MAC Level of consciousness: awake and alert Pain management: pain level controlled Vital Signs Assessment: post-procedure vital signs reviewed and stable Respiratory status: spontaneous breathing, nonlabored ventilation and respiratory function stable Cardiovascular status: stable and blood pressure returned to baseline Anesthetic complications: no   No notable events documented.  Last Vitals:  Vitals:   02/07/24 1959 02/07/24 2000  BP: 108/80 108/80  Pulse: 77 72  Resp: 17 17  Temp:    SpO2:  98%    Last Pain:  Vitals:   02/07/24 2000  TempSrc:   PainSc: 0-No pain   Pain Goal: Patients Stated Pain Goal: 1 (02/07/24 1428)                 Beryle Lathe

## 2024-02-07 NOTE — Progress Notes (Signed)
 Hypoglycemic Event  CBG: 52  Treatment: 4oz juice  Symptoms: None  Follow-up CBG: Time:2000 CBG Result:55  Possible Reasons for Event: Inadequate meal intake, labor, anesthesia  Comments/MD notified:Brock notified, repeat cbg in 20 min   Luna Fuse

## 2024-02-07 NOTE — Anesthesia Preprocedure Evaluation (Addendum)
 Anesthesia Evaluation  Patient identified by MRN, date of birth, ID band Patient awake    Reviewed: Allergy & Precautions, NPO status , Patient's Chart, lab work & pertinent test results, reviewed documented beta blocker date and time   History of Anesthesia Complications Negative for: history of anesthetic complications  Airway Mallampati: II  TM Distance: >3 FB Neck ROM: Full    Dental  (+) Dental Advisory Given   Pulmonary former smoker   Pulmonary exam normal        Cardiovascular hypertension, Pt. on home beta blockers and Pt. on medications Normal cardiovascular exam     Neuro/Psych  PSYCHIATRIC DISORDERS Anxiety Depression    negative neurological ROS     GI/Hepatic negative GI ROS, Neg liver ROS,,,  Endo/Other  diabetes, Type 2, Oral Hypoglycemic Agents, Insulin Dependent   Obesity   Renal/GU negative Renal ROS     Musculoskeletal negative musculoskeletal ROS (+)    Abdominal   Peds  (+) ADHD Hematology  (+) Blood dyscrasia, anemia   Anesthesia Other Findings HSV  Reproductive/Obstetrics  PCOS Delivered [redacted]w[redacted]d fetus ~ 4 hr ago                               Anesthesia Physical Anesthesia Plan  ASA: 2  Anesthesia Plan: MAC   Post-op Pain Management: Tylenol PO (pre-op)*   Induction:   PONV Risk Score and Plan: 2 and Treatment may vary due to age or medical condition, Ondansetron, Midazolam and Propofol infusion  Airway Management Planned: Simple Face Mask and Natural Airway  Additional Equipment: None  Intra-op Plan:   Post-operative Plan:   Informed Consent: I have reviewed the patients History and Physical, chart, labs and discussed the procedure including the risks, benefits and alternatives for the proposed anesthesia with the patient or authorized representative who has indicated his/her understanding and acceptance.       Plan Discussed with: CRNA and  Anesthesiologist  Anesthesia Plan Comments:        Anesthesia Quick Evaluation

## 2024-02-08 LAB — CBC
HCT: 30.8 % — ABNORMAL LOW (ref 36.0–46.0)
Hemoglobin: 10 g/dL — ABNORMAL LOW (ref 12.0–15.0)
MCH: 27 pg (ref 26.0–34.0)
MCHC: 32.5 g/dL (ref 30.0–36.0)
MCV: 83 fL (ref 80.0–100.0)
Platelets: 290 10*3/uL (ref 150–400)
RBC: 3.71 MIL/uL — ABNORMAL LOW (ref 3.87–5.11)
RDW: 14.1 % (ref 11.5–15.5)
WBC: 8.3 10*3/uL (ref 4.0–10.5)
nRBC: 0 % (ref 0.0–0.2)

## 2024-02-08 LAB — BASIC METABOLIC PANEL
Anion gap: 10 (ref 5–15)
BUN: 12 mg/dL (ref 6–20)
CO2: 23 mmol/L (ref 22–32)
Calcium: 9.5 mg/dL (ref 8.9–10.3)
Chloride: 104 mmol/L (ref 98–111)
Creatinine, Ser: 0.82 mg/dL (ref 0.44–1.00)
GFR, Estimated: >60 mL/min (ref >60)
Glucose, Bld: 89 mg/dL (ref 70–99)
Potassium: 3.7 mmol/L (ref 3.5–5.1)
Sodium: 137 mmol/L (ref 135–145)

## 2024-02-08 LAB — GLUCOSE, CAPILLARY
Glucose-Capillary: 104 mg/dL — ABNORMAL HIGH (ref 70–99)
Glucose-Capillary: 113 mg/dL — ABNORMAL HIGH (ref 70–99)
Glucose-Capillary: 87 mg/dL (ref 70–99)
Glucose-Capillary: 88 mg/dL (ref 70–99)
Glucose-Capillary: 94 mg/dL (ref 70–99)

## 2024-02-08 MED ORDER — SODIUM CHLORIDE 0.9 % IV SOLN
3.0000 g | Freq: Four times a day (QID) | INTRAVENOUS | Status: AC
  Administered 2024-02-08 – 2024-02-09 (×4): 3 g via INTRAVENOUS
  Filled 2024-02-08 (×4): qty 8

## 2024-02-08 MED ORDER — IBUPROFEN 600 MG PO TABS
600.0000 mg | ORAL_TABLET | Freq: Four times a day (QID) | ORAL | Status: DC
  Administered 2024-02-08 – 2024-02-10 (×6): 600 mg via ORAL
  Filled 2024-02-08 (×5): qty 1

## 2024-02-08 NOTE — Progress Notes (Signed)
   02/08/24 1055  Spiritual Encounters  Type of Visit Initial  Care provided to: Patient  Conversation partners present during encounter Nurse  Referral source Social worker/Care management/TOC  Reason for visit  (Fetal Demise)  OnCall Visit Yes   Chaplain Assessment  Chaplain's Reason for Visit: Chaplain visited Pt in response to a call from the CSW. CSW explained that their procedure when receiving a consult for fetal demise is to refer first to Spiritual Care and then follow after if necessary.  Chaplain time spent: 45 minutes  Chaplain Interventions: Established relationship of care and support Explored Pt spiritual, emotional, and relational needs and resources Facilitated storytelling Listened empathetically as Pt emotionally/tearfully processed current crisis Provided grief support and education  Chaplain Assessment: Pt Needs Spiritual: Pt experiencing lack of peace as she works through the "annoyance" (her words) and frustration experience fetal loss for the second time. Emotional: Pt experiencing bouts of anger, grief, anxiety, and emotional fatigue. Relational: Pt experiencing strong feelings of empathy for her boyfriend whom she knows wanted this child so much.  He is a good support to her Intermediate hopes: no identifiable immediate hopes indicated.  Ultimate hopes: Pt states a strong desire to have a child and to experience healthy pregnancy and childbirth  Pt Resources  Resources Identified:  Pt is wise for her age, and demonstrates strong emotional intelligence and maturity. Pt has a loving boyfriend to support her, along with her mother (who is coming down from [place]) and an aunt (?) Pt did not express any particular religious views, but did reference issues of "fate" and "what ever is supposed to be will be" (Needs Support, Supported, Well Supported & Strong) Spiritual: Supported Emotional: Supported Relational: Support  Additional Assessment: Chaplain offered  the opportunity for Pt to share he "grief story" and share with me the details of the pregnancy and the crisis leading up to the loss.  Chaplain encouraged story-telling for healing to begin. Pt shared that this is the second fetal loss she has experienced- the previous one being in August.  Pt also stated she experience a couple early first trimmest miscarriages before that. Pt enjoys doing "good" research (not just using Google) on the pregnancy issues she has experienced and used the knowledge she gains from research to calm her anxieties- information is coping mechanism for her. Pt willingly and generously shared the space with me as we spoke, and graciously accepted my presence and ministry.  Chaplaincy Plan: Tammy [name] will f/u with CSW who will visit Pt. Chaplain will also refer Pt. to chaplain teammate who will be here tomorrow.   Chaplain Tammy [name], MDiv [name]

## 2024-02-08 NOTE — Plan of Care (Signed)
 Pt has c/o pain and given prn oxycodone 5mg  Will continue to monitor abdominal pain. Birth certicate completed and MOB called [name] to start process of death certificate for infant.

## 2024-02-08 NOTE — Progress Notes (Signed)
 CSW received consult due to fetal loss at 17 weeks.  CSW available for support secondary to [facility] and will await call from Chaplain before becoming involved.  CSW screening out referral at this time.   CSW spoke with spiritual care rep and requested for pt to be seen.   Signed,  Norberto Sorenson, MSW, [place], Bridget Hartshorn 02/08/2024 8:31 AM

## 2024-02-08 NOTE — Progress Notes (Addendum)
 CSW was consulted per patient request due to fetal demise at 17 weeks and history of depression. CSW met with patient at bedside to assess for needs and provide support. When CSW entered room, patient was observed sitting in bed with shadow memory box in hand. Fetus "Seward Grater" was observed in bassinet. CSW introduced self and explained reason for consult. Patient presented with a flat affect. Patient welcomed CSW visit. CSW expressed condolences and inquired how patient is feeling. Patient expressed that she has been "okay" and feels that she "got it all out" talking with Chaplain who had recently exited the room. CSW inquired about patient's interest in grief resources, patient was accepting. CSW provided patient with contact information for [facility], infant loss support groups, and mental health crisis and outpatient resources.  CSW inquired about patient's mental health history. Patient shared she has a diagnosis of anxiety and was previously prescribed Buspar. Patient states that she currently takes Lexapro and is prescribed through a virtual psychiatrist, [facility]. Patient shares that she feels Lexapro is helpful to manage symptoms of anxiety but notes racing thoughts at night. Patient states that she recently missed a psychiatry follow up appointment but plans to continue with Lexapro. Patient is not current with a therapist. Patient aware of crisis and outpatient mental health resources provided. Patient denied current SI/HI/DV.  CSW provided education regarding the baby blues period and grief response vs. perinatal mood disorders, discussed treatment and gave resources for mental health follow up if concerns arise.   CSW inquired about supports. Patient shared her mother is traveling from [place] and will be staying with her. Patient also identified her cat and dog as comforts. Patient shared that this is her second fetal loss and shared how taking time to be by herself and grieve was  helpful after the loss of her first baby. CSW and patient discussed the importance of allowing space to process feelings to facilitate healing and CSW provided supportive words. CSW inquired about additional needs for supports/resources, patient denied additional needs.  CSW identifies no further need for intervention and no barriers to discharge at this time.  Signed,  Norberto Sorenson, MSW, [place], LCASA 02/08/2024 12:11 PM

## 2024-02-08 NOTE — Plan of Care (Signed)
 Patient has received shadow box and flowers from the grief support network. VSS C/O right sided abdominal pain 4 times today. Dr Su Hilt came by to assess her abdomen and started on IV Unasyn. Received several doses of PRN Oxycodone but by the end of shift pt states her pain is now 0/10. [name] is in bed with mom. Cuddle cot beside moms bed. Will continue to assess for any concerns.

## 2024-02-08 NOTE — Progress Notes (Addendum)
 Post Partum Day 1 Subjective: Haven't a bit of a tough time dealing with the loss and reports no support at home.  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  Objective: Blood pressure 118/78, pulse 95, temperature 97.9 F (36.6 C), temperature source Oral, resp. rate 18, height 5\' 8"  (1.727 m), weight 106.6 kg, last menstrual period 10/07/2023, SpO2 99%, unknown if currently breastfeeding.  Physical Exam:  General: alert, cooperative, and no distress Lochia: appropriate Uterine Fundus: FF, tender Incision: n/a DVT Evaluation: No evidence of DVT seen on physical exam.  Recent Labs    02/06/24 0415 02/08/24 0536  HGB 10.4* 10.0*  HCT 31.1* 30.8*    Assessment/Plan: PPD 1 from NSVD of 17wk PPROM and POD 1 from suction D&C Endometritis - will start unasyn for 24hrs Cont routine PP care Questions answered Cont fasting and 2hr after meals CBGs with coverage as needed   LOS: 5 days   Purcell Nails, MD 02/08/2024, 3:23 PM

## 2024-02-09 ENCOUNTER — Encounter (HOSPITAL_COMMUNITY): Payer: Self-pay | Admitting: Obstetrics and Gynecology

## 2024-02-09 LAB — GLUCOSE, CAPILLARY
Glucose-Capillary: 88 mg/dL (ref 70–99)
Glucose-Capillary: 91 mg/dL (ref 70–99)
Glucose-Capillary: 92 mg/dL (ref 70–99)

## 2024-02-09 MED ORDER — SODIUM CHLORIDE 0.9 % IV SOLN: INTRAVENOUS | Status: AC | PRN

## 2024-02-09 MED ORDER — SODIUM CHLORIDE 0.9 % IV SOLN
3.0000 g | Freq: Four times a day (QID) | INTRAVENOUS | Status: DC
  Administered 2024-02-09 – 2024-02-10 (×3): 3 g via INTRAVENOUS
  Filled 2024-02-09 (×3): qty 8

## 2024-02-09 NOTE — Progress Notes (Signed)
 Patient ID: Tammy [name], female   DOB: [birth year], 33 y.o.   MRN: [id number] Pt till has some pain in fundus.  No heavy bleeding BP 112/63   Pulse 88   Temp 98.3 F (36.8 C) (Oral)   Resp 18   Ht 5\' 8"  (1.727 m)   Wt 106.6 kg   LMP 10/07/2023   SpO2 98%   Breastfeeding Unknown   BMI 35.73 kg/m  Physical Examination: General appearance - alert, well appearing, and in no distress Chest - clear to auscultation, no wheezes, rales or rhonchi, symmetric air entry Heart - normal rate and regular rhythm Abdomen - soft with mild fundal tenderness  Extremities - peripheral pulses normal, no pedal edema, no clubbing or cyanosis, Homan's sign negative bilaterally PPD 2 S/P SVD with endometirits Continue ebx for 48 hours with pain management Probable DC tomorrow

## 2024-02-10 LAB — GLUCOSE, CAPILLARY
Glucose-Capillary: 94 mg/dL (ref 70–99)
Glucose-Capillary: 89 mg/dL (ref 70–99)

## 2024-02-10 LAB — SURGICAL PATHOLOGY

## 2024-02-10 MED ORDER — IBUPROFEN 600 MG PO TABS: 600.0000 mg | ORAL_TABLET | Freq: Four times a day (QID) | ORAL | 1 refills | Status: DC | PRN

## 2024-02-10 MED ORDER — OXYCODONE HCL 5 MG PO TABS: 5.0000 mg | ORAL_TABLET | Freq: Four times a day (QID) | ORAL | 0 refills | Status: DC | PRN

## 2024-02-10 MED ORDER — SODIUM CHLORIDE 0.9 % IV SOLN: INTRAVENOUS | Status: DC | PRN

## 2024-02-10 NOTE — Plan of Care (Signed)
  Problem: Education: Goal: Ability to describe self-care measures that may prevent or decrease complications (Diabetes Survival Skills Education) will improve Outcome: Adequate for Discharge Goal: Individualized Educational Video(s) Outcome: Adequate for Discharge   Problem: Coping: Goal: Ability to adjust to condition or change in health will improve Outcome: Adequate for Discharge   Problem: Fluid Volume: Goal: Ability to maintain a balanced intake and output will improve Outcome: Adequate for Discharge   Problem: Health Behavior/Discharge Planning: Goal: Ability to identify and utilize available resources and services will improve Outcome: Adequate for Discharge Goal: Ability to manage health-related needs will improve Outcome: Adequate for Discharge   Problem: Metabolic: Goal: Ability to maintain appropriate glucose levels will improve Outcome: Adequate for Discharge   Problem: Nutritional: Goal: Maintenance of adequate nutrition will improve Outcome: Adequate for Discharge Goal: Progress toward achieving an optimal weight will improve Outcome: Adequate for Discharge   Problem: Skin Integrity: Goal: Risk for impaired skin integrity will decrease Outcome: Adequate for Discharge   Problem: Tissue Perfusion: Goal: Adequacy of tissue perfusion will improve Outcome: Adequate for Discharge   Problem: Education: Goal: Knowledge of disease or condition will improve Outcome: Adequate for Discharge Goal: Knowledge of the prescribed therapeutic regimen will improve Outcome: Adequate for Discharge Goal: Individualized Educational Video(s) Outcome: Adequate for Discharge   Problem: Clinical Measurements: Goal: Complications related to the disease process, condition or treatment will be avoided or minimized Outcome: Adequate for Discharge   Problem: Education: Goal: Knowledge of General Education information will improve Description: Including pain rating scale,  medication(s)/side effects and non-pharmacologic comfort measures Outcome: Adequate for Discharge   Problem: Health Behavior/Discharge Planning: Goal: Ability to manage health-related needs will improve Outcome: Adequate for Discharge   Problem: Clinical Measurements: Goal: Ability to maintain clinical measurements within normal limits will improve Outcome: Adequate for Discharge Goal: Will remain free from infection Outcome: Adequate for Discharge Goal: Diagnostic test results will improve Outcome: Adequate for Discharge Goal: Respiratory complications will improve Outcome: Adequate for Discharge Goal: Cardiovascular complication will be avoided Outcome: Adequate for Discharge   Problem: Activity: Goal: Risk for activity intolerance will decrease Outcome: Adequate for Discharge   Problem: Nutrition: Goal: Adequate nutrition will be maintained Outcome: Adequate for Discharge   Problem: Coping: Goal: Level of anxiety will decrease Outcome: Adequate for Discharge   Problem: Elimination: Goal: Will not experience complications related to bowel motility Outcome: Adequate for Discharge Goal: Will not experience complications related to urinary retention Outcome: Adequate for Discharge   Problem: Pain Managment: Goal: General experience of comfort will improve and/or be controlled Outcome: Adequate for Discharge   Problem: Safety: Goal: Ability to remain free from injury will improve Outcome: Adequate for Discharge   Problem: Skin Integrity: Goal: Risk for impaired skin integrity will decrease Outcome: Adequate for Discharge   Problem: Education: Goal: Knowledge of the prescribed therapeutic regimen will improve Outcome: Adequate for Discharge Goal: Understanding of sexual limitations or changes related to disease process or condition will improve Outcome: Adequate for Discharge Goal: Individualized Educational Video(s) Outcome: Adequate for Discharge   Problem:  Self-Concept: Goal: Communication of feelings regarding changes in body function or appearance will improve Outcome: Adequate for Discharge   Problem: Skin Integrity: Goal: Demonstration of wound healing without infection will improve Outcome: Adequate for Discharge

## 2024-02-10 NOTE — Discharge Summary (Signed)
 Physician Discharge Summary  Patient ID: Tammy [name] MRN: [id number] DOB/AGE: [birth year] 33 y.o.  Admit date: 02/03/2024 Discharge date: 02/10/2024  Admission Diagnoses: PPROM DM ADHD Incomeptent Cervix BV HSV 2 HTN  Discharge Diagnoses:  Principal Problem:   Preterm premature rupture of membranes (PPROM) delivered, current hospitalization Active Problems:   DM (diabetes mellitus) (HCC)   Incompetence of cervix   Bacterial vaginosis   HSV-2 seropositive   Discharged Condition: good  Hospital Course: Admitted with PPROM s/p rescue cerclage.  Pt initially wanted everything done but after processing prognosis decided on termination but spontaneously delivered about 48hrs after cerclage removed.  PP endometritis after suction D&C for retained placenta and improved after 48hrs of IV antibiotics.  Pt had baby at bedside during length of hospitalization.  Consults:  MFM  Significant Diagnostic Studies: ultrasound  Treatments: IV antibiotics, suction D&C for retained placenta  Discharge Exam: Blood pressure 132/89, pulse 91, temperature 98 F (36.7 C), temperature source Oral, resp. rate 17, height 5\' 8"  (1.727 m), weight 106.6 kg, last menstrual period 10/07/2023, SpO2 100%, unknown if currently breastfeeding. General appearance: alert, cooperative, and no distress Resp: unlabored Cardio: regular rate and rhythm Extremities: no calf tenderness  Disposition: Discharge disposition: 01-Home or Self Care        Allergies as of 02/10/2024   No Known Allergies      Medication List     STOP taking these medications    aspirin EC 81 MG tablet       TAKE these medications    amphetamine-dextroamphetamine 10 MG 24 hr capsule Commonly known as: ADDERALL XR Take 10 mg by mouth daily.   cholecalciferol 25 MCG (1000 UNIT) tablet Commonly known as: VITAMIN D3 Take 1,000 Units by mouth daily.   escitalopram 5 MG tablet Commonly known as: LEXAPRO Take 10 mg by  mouth daily.   ibuprofen 600 MG tablet Commonly known as: ADVIL Take 1 tablet (600 mg total) by mouth every 6 (six) hours as needed.   labetalol 100 MG tablet Commonly known as: NORMODYNE Take 100 mg by mouth 2 (two) times daily.   metFORMIN 500 MG tablet Commonly known as: GLUCOPHAGE Take 500 mg by mouth. At night   oxyCODONE 5 MG immediate release tablet Commonly known as: Oxy IR/ROXICODONE Take 1 tablet (5 mg total) by mouth every 6 (six) hours as needed for moderate pain (pain score 4-6).   prenatal multivitamin Tabs tablet Take 1 tablet by mouth daily at 12 noon.         Signed: Purcell Nails 02/10/2024, 1:58 PM

## 2024-02-17 ENCOUNTER — Telehealth (HOSPITAL_COMMUNITY): Payer: Self-pay | Admitting: *Deleted

## 2024-02-17 NOTE — Telephone Encounter (Signed)
 02/17/2024  Name: Tammy [name] MRN: [id number] DOB: Apr 16, 1991  Reason for Call:  Transition of Care Hospital Discharge Call  Contact Status: Patient Contact Status: Complete  Language assistant needed: Interpreter Mode: Interpreter Not Needed        Follow-Up Questions: Do You Have Any Concerns About Your Health As You Heal From Delivery?: No Do You Have Any Concerns About Your Infants Health?:  (Fetal demise)  Edinburgh Postnatal Depression Scale:  In the Past 7 Days:   Patient had OB visit with Dr. Su Hilt yesterday, 02/16/2024. Patient states she completed her EPDS in the office yesterday and scored a 14. Patient states that Dr. Su Hilt suggested that she speak with her psychiatrist about adjusting her medications. Patient states she has an appointment scheduled with her psychiatrist tomorrow, 02/18/2024 to work through navigating her infant's loss.   PHQ2-9 Depression Scale:     Discharge Follow-up: Edinburgh score requires follow up?: No Patient was advised of the following resources:: Bereavement (Grief support for pregnancy, infant and child loss)   Post-discharge interventions: Maternal Mental Health Resources provided  [place], [place] 02/17/2024 1122

## 2024-02-19 ENCOUNTER — Ambulatory Visit: Payer: Medicaid Other

## 2024-02-19 ENCOUNTER — Other Ambulatory Visit: Payer: Medicaid Other

## 2024-04-05 ENCOUNTER — Ambulatory Visit
Admission: EM | Admit: 2024-04-05 | Discharge: 2024-04-05 | Disposition: A | Discharge status: Home / Self Care | Attending: Internal Medicine | Admitting: Internal Medicine

## 2024-04-05 ENCOUNTER — Encounter: Payer: Self-pay | Admitting: Emergency Medicine

## 2024-04-05 DIAGNOSIS — M62831 Muscle spasm of calf: Secondary | ICD-10-CM | POA: Diagnosis not present

## 2024-04-05 DIAGNOSIS — M79661 Pain in right lower leg: Secondary | ICD-10-CM | POA: Diagnosis not present

## 2024-04-05 MED ORDER — BACLOFEN 10 MG PO TABS: 10.0000 mg | ORAL_TABLET | Freq: Three times a day (TID) | ORAL | 0 refills | Status: DC

## 2024-04-05 NOTE — ED Provider Notes (Addendum)
 [name]    CSN: [id number] Arrival date & time: 04/05/24  1706      History   Chief Complaint Chief Complaint  Patient presents with   Leg Pain    HPI Tammy [name] is a 33 y.o. female.   Tammy [name] is a 33 y.o. female presenting for chief complaint of right posterior calf and posterior ankle pain that started 2-3 weeks ago without known injury/trauma. Pain is localized to the posterior distal right calf and intermittently radiates to the mid upper calf. She denies redness/swelling associated with pain. She works a sedentary job at [employer] and wonders if positioning at [employer] is contributing to pain. Pain improves with rest and is triggered by weightbearing activity. Pain worsens throughout the day as she is up and moving. Denies recent changes in activity, paresthesias distally to pain, and previous injury to the right calf/foot/ankle. She has been taking ibuprofen  daily for the last 2 months due to pains related to recent pregnancy loss. She stopped taking ibuprofen  a few weeks ago and states this is when she noticed the pain. She has resumed taking ibuprofen  again and states she's still able to feel the pain despite taking NSAID.    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   Preterm premature rupture of membranes (PPROM) delivered, current hospitalization 02/03/2024   DM (diabetes mellitus) (HCC) 12/25/2023   Obesity affecting pregnancy, antepartum 12/25/2023   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 12/25/2023   Lab test positive for detection of COVID-19 virus 07/19/2023   Acute blood loss anemia (ABLA) 07/19/2023    Premature rupture of membranes 07/17/2023   IUFD at less than 20 weeks of gestation 07/17/2023   Chronic hypertension affecting pregnancy 08/25/2022    Past Surgical History:  Procedure Laterality Date   CERVICAL CERCLAGE Bilateral 01/23/2024   Procedure: CERCLAGE CERVICAL;  Surgeon: Renea Carrion, MD;  Location: [facility];  Service: Gynecology;  Laterality: Bilateral;   DILATION AND EVACUATION N/A 02/07/2024   Procedure: DILATATION AND EVACUATION;  Surgeon: Renea Carrion, MD;  Location: [facility];  Service: Gynecology;  Laterality: N/A;   SHOULDER SURGERY      OB History     Gravida  4   Para  1   Term      Preterm  1   AB  3   Living  1      SAB  3   IAB      Ectopic      Multiple  0   Live Births  1            Home Medications    Prior to Admission medications   Medication Sig Start Date End Date Taking? Authorizing Provider  baclofen  (LIORESAL ) 10 MG tablet Take 1 tablet (10 mg total) by mouth 3 (three) times daily. 04/05/24  Yes Starlene Eaton, FNP  amphetamine -dextroamphetamine  (ADDERALL XR) 10 MG 24 hr capsule Take 10 mg by mouth daily.    [provider]  cholecalciferol (VITAMIN D3) 25 MCG (1000 UNIT) tablet Take 1,000 Units by mouth daily.  [provider]  escitalopram  (LEXAPRO ) 5 MG tablet Take 10 mg by mouth daily.    [provider]  ibuprofen  (ADVIL ) 600 MG tablet Take 1 tablet (600 mg total) by mouth every 6 (six) hours as needed. 02/10/24   Renea Carrion, MD  labetalol  (NORMODYNE ) 100 MG tablet Take 100 mg by mouth 2 (two) times daily.    [provider]  metFORMIN  (GLUCOPHAGE ) 500 MG tablet Take 500 mg by mouth. At night    [provider]  oxyCODONE  (OXY IR/ROXICODONE ) 5 MG immediate release tablet Take 1 tablet (5 mg total) by mouth every 6 (six) hours as needed for moderate pain (pain score 4-6). 02/10/24   Renea Carrion, MD  Prenatal Vit-Fe Fumarate-FA (PRENATAL MULTIVITAMIN) TABS  tablet Take 1 tablet by mouth daily at 12 noon.    [provider]    Family History History reviewed. No pertinent family history.  Social History Social History   Tobacco Use   Smoking status: Former    Types: Cigarettes   Smokeless tobacco: Never  Vaping Use   Vaping status: Former  Substance Use Topics   Alcohol use: Not Currently    Comment: weekly   Drug use: Not Currently     Allergies   Patient has no known allergies.   Review of Systems Review of Systems Per HPI  Physical Exam Triage Vital Signs ED Triage Vitals  Encounter Vitals Group     BP 04/05/24 1713 129/86     Systolic BP Percentile --      Diastolic BP Percentile --      Pulse Rate 04/05/24 1713 85     Resp 04/05/24 1713 17     Temp 04/05/24 1713 98.3 F (36.8 C)     Temp Source 04/05/24 1713 Oral     SpO2 04/05/24 1713 96 %     Weight --      Height --      Head Circumference --      Peak Flow --      Pain Score 04/05/24 1717 5     Pain Loc --      Pain Education --      Exclude from Growth Chart --    No data found.  Updated Vital Signs BP 129/86 (BP Location: Right Arm)   Pulse 85   Temp 98.3 F (36.8 C) (Oral)   Resp 17   LMP 10/07/2023   SpO2 96%   Breastfeeding No   Visual Acuity Right Eye Distance:   Left Eye Distance:   Bilateral Distance:    Right Eye Near:   Left Eye Near:    Bilateral Near:     Physical Exam Vitals and nursing note reviewed.  Constitutional:      Appearance: She is not ill-appearing or toxic-appearing.  HENT:     Head: Normocephalic and atraumatic.     Right Ear: Hearing and external ear normal.     Left Ear: Hearing and external ear normal.     Nose: Nose normal.     Mouth/Throat:     Lips: Pink.  Eyes:     General: Lids are normal. Vision grossly intact. Gaze aligned appropriately.     Extraocular Movements: Extraocular movements intact.     Conjunctiva/sclera: Conjunctivae normal.  Pulmonary:     Effort: Pulmonary effort  is normal.  Musculoskeletal:     Cervical back: Neck supple.     Right knee: Normal.     Left knee: Normal.  Right lower leg: Tenderness present. No swelling, deformity, lacerations or bony tenderness. No edema.     Left lower leg: Normal.     Right ankle: Normal.     Right Achilles Tendon: Normal.     Right foot: Normal.       Legs:     Comments: Homan's sign negative to right lower extremity. No soft tissue swelling or erythema to the right calf/ankle. +2 right dorsalis pedis pulse. 5/5 strength against resistance with dorsiflexion and plantar flexion at right ankle. Sensation intact distally. Ambulatory with steady gait without limp.   Skin:    General: Skin is warm and dry.     Capillary Refill: Capillary refill takes less than 2 seconds.     Findings: No rash.  Neurological:     General: No focal deficit present.     Mental Status: She is alert and oriented to person, place, and time. Mental status is at baseline.     Cranial Nerves: No dysarthria or facial asymmetry.  Psychiatric:        Mood and Affect: Mood normal.        Speech: Speech normal.        Behavior: Behavior normal.        Thought Content: Thought content normal.        Judgment: Judgment normal.      UC Treatments / Results  Labs (all labs ordered are listed, but only abnormal results are displayed) Labs Reviewed - No data to display  EKG   Radiology No results found.  Procedures Procedures (including critical care time)  Medications Ordered in UC Medications - No data to display  Initial Impression / Assessment and Plan / UC Course  I have reviewed the triage vital signs and the nursing notes.  Pertinent labs & imaging results that were available during my care of the patient were reviewed by me and considered in my medical decision making (see chart for details).   1. Right calf pain, muscle spasm of right calf Unclear cause of patient's pain, suspect muscle spasm/tendinopathy given  presentation with pain triggered by weightbearing activity and atraumatic presentation. Stable MSK findings, low suspicion for acute bony abnormality, therefore deferred imaging of the right calf/ankle.   Of note, patient recently pregnant 2 months ago and there is concern for clotting etiology given recent hypercoagulable state. Low suspicion for DVT given lack of redness/swelling on exam. She is neurovascularly intact to the distal bilateral lower extremities and Homan's sign is negative.  Recommend use of supportive tennis shoes and ankle brace/sleeve for compression of the foot, ankle, and calf to provide better support with ambulation. Will use antiinflammatories to treat this (ibuprofen  600mg  every 6 hours as needed for pain) as well as baclofen  muscle relaxer as needed for muscle spasm component.  Heat and gentle stretches recommended. Recommend follow-up with [facility] if symptoms fail to improve in the next 5-7 days with use of supportive care.   Counseled patient on potential for adverse effects with medications prescribed/recommended today, strict ER and return-to-clinic precautions discussed, patient verbalized understanding.    Final Clinical Impressions(s) / UC Diagnoses   Final diagnoses:  Right calf pain  Muscle spasm of right calf     Discharge Instructions      Use heat to the area of pain at the left calf. Ibuprofen  as needed for pain. Baclofen  muscle relaxer as needed for muscle spasm, mostly take at bedtime as this causes drowsiness.  Wear supportive  shoes. Purchase ankle brace for compression. Follow-up with [facility] if symptoms fail to improve.  Happy Early [place]!   If you develop any new or worsening symptoms or if your symptoms do not start to improve, please return here or follow-up with your primary care provider. If your symptoms are severe, please go to the emergency room.    ED Prescriptions     Medication Sig Dispense  Auth. Provider   baclofen  (LIORESAL ) 10 MG tablet Take 1 tablet (10 mg total) by mouth 3 (three) times daily. 30 each Starlene Eaton, FNP      PDMP not reviewed this encounter.   Starlene Eaton, FNP 04/05/24 2141    Starlene Eaton, FNP 04/05/24 [phone]

## 2024-04-05 NOTE — ED Triage Notes (Addendum)
 Pt presents with right foot and calf pain and swelling that began two days ago. States pain started in heel and felt like a lump. She massaged foot and pain has moved up leg to calf.     She was pregnant two months ago and was advised to be seen if having leg pain.

## 2024-04-05 NOTE — Discharge Instructions (Addendum)
 Use heat to the area of pain at the left calf. Ibuprofen  as needed for pain. Baclofen  muscle relaxer as needed for muscle spasm, mostly take at bedtime as this causes drowsiness.  Wear supportive shoes. Purchase ankle brace for compression. Follow-up with [facility] if symptoms fail to improve.  Happy Early [place]!   If you develop any new or worsening symptoms or if your symptoms do not start to improve, please return here or follow-up with your primary care provider. If your symptoms are severe, please go to the emergency room.

## 2024-04-13 ENCOUNTER — Ambulatory Visit: Admitting: Podiatry

## 2024-04-20 ENCOUNTER — Ambulatory Visit: Admitting: Podiatry

## 2024-06-28 ENCOUNTER — Ambulatory Visit: Admitting: "Endocrinology

## 2024-08-05 LAB — OB RESULTS CONSOLE HEPATITIS B SURFACE ANTIGEN: Hepatitis B Surface Ag: NEGATIVE

## 2024-08-05 LAB — OB RESULTS CONSOLE GC/CHLAMYDIA
Chlamydia: NEGATIVE
Neisseria Gonorrhea: NEGATIVE

## 2024-08-05 LAB — OB RESULTS CONSOLE ABO/RH: "RH Type ": POSITIVE

## 2024-08-05 LAB — OB RESULTS CONSOLE RUBELLA ANTIBODY, IGM: Rubella: IMMUNE

## 2024-08-05 LAB — OB RESULTS CONSOLE ANTIBODY SCREEN: Antibody Screen: NEGATIVE

## 2024-08-05 LAB — OB RESULTS CONSOLE HIV ANTIBODY (ROUTINE TESTING): HIV: NONREACTIVE

## 2024-08-05 LAB — OB RESULTS CONSOLE RPR: RPR: NONREACTIVE

## 2024-08-18 ENCOUNTER — Other Ambulatory Visit: Payer: Self-pay | Admitting: Obstetrics and Gynecology

## 2024-08-19 ENCOUNTER — Telehealth (HOSPITAL_COMMUNITY): Payer: Self-pay | Admitting: *Deleted

## 2024-08-19 NOTE — Telephone Encounter (Signed)
 Preadmission screen

## 2024-08-20 ENCOUNTER — Encounter (HOSPITAL_COMMUNITY): Payer: Self-pay | Admitting: *Deleted

## 2024-08-20 ENCOUNTER — Telehealth (HOSPITAL_COMMUNITY): Payer: Self-pay | Admitting: *Deleted

## 2024-08-20 ENCOUNTER — Other Ambulatory Visit: Payer: Self-pay | Admitting: Obstetrics and Gynecology

## 2024-08-20 DIAGNOSIS — O09292 Supervision of pregnancy with other poor reproductive or obstetric history, second trimester: Secondary | ICD-10-CM

## 2024-08-20 NOTE — Telephone Encounter (Signed)
 Preadmission screen Arrive at 0915 nothing to eat p [place].  Clear liquids until 0715.  Take labetalol  and Sertraline as prescribed.  Verbalized understanding.  Questions answered.

## 2024-08-21 DIAGNOSIS — D563 Thalassemia minor: Secondary | ICD-10-CM | POA: Insufficient documentation

## 2024-08-25 ENCOUNTER — Encounter (HOSPITAL_COMMUNITY): Payer: Self-pay | Admitting: Obstetrics and Gynecology

## 2024-08-25 ENCOUNTER — Encounter (HOSPITAL_COMMUNITY)
Admission: RE | Disposition: A | Discharge status: Home / Self Care | Payer: Self-pay | Source: Home / Self Care | Attending: Obstetrics and Gynecology

## 2024-08-25 ENCOUNTER — Other Ambulatory Visit: Payer: Self-pay

## 2024-08-25 ENCOUNTER — Observation Stay (HOSPITAL_COMMUNITY)
Admission: RE | Admit: 2024-08-25 | Discharge: 2024-08-26 | Disposition: A | Discharge status: Home / Self Care | Attending: Obstetrics and Gynecology | Admitting: Obstetrics and Gynecology

## 2024-08-25 ENCOUNTER — Ambulatory Visit (HOSPITAL_BASED_OUTPATIENT_CLINIC_OR_DEPARTMENT_OTHER): Admitting: Anesthesiology

## 2024-08-25 ENCOUNTER — Ambulatory Visit (HOSPITAL_COMMUNITY): Admitting: Anesthesiology

## 2024-08-25 DIAGNOSIS — F419 Anxiety disorder, unspecified: Secondary | ICD-10-CM | POA: Insufficient documentation

## 2024-08-25 DIAGNOSIS — F418 Other specified anxiety disorders: Secondary | ICD-10-CM

## 2024-08-25 DIAGNOSIS — O99342 Other mental disorders complicating pregnancy, second trimester: Secondary | ICD-10-CM | POA: Insufficient documentation

## 2024-08-25 DIAGNOSIS — O3432 Maternal care for cervical incompetence, second trimester: Secondary | ICD-10-CM

## 2024-08-25 DIAGNOSIS — O09292 Supervision of pregnancy with other poor reproductive or obstetric history, second trimester: Secondary | ICD-10-CM | POA: Insufficient documentation

## 2024-08-25 DIAGNOSIS — Z87891 Personal history of nicotine dependence: Secondary | ICD-10-CM | POA: Diagnosis not present

## 2024-08-25 DIAGNOSIS — Z3490 Encounter for supervision of normal pregnancy, unspecified, unspecified trimester: Secondary | ICD-10-CM

## 2024-08-25 DIAGNOSIS — Z7982 Long term (current) use of aspirin: Secondary | ICD-10-CM | POA: Insufficient documentation

## 2024-08-25 DIAGNOSIS — Z3A15 15 weeks gestation of pregnancy: Secondary | ICD-10-CM | POA: Diagnosis not present

## 2024-08-25 DIAGNOSIS — Z3A17 17 weeks gestation of pregnancy: Secondary | ICD-10-CM

## 2024-08-25 DIAGNOSIS — O24419 Gestational diabetes mellitus in pregnancy, unspecified control: Secondary | ICD-10-CM | POA: Insufficient documentation

## 2024-08-25 DIAGNOSIS — N883 Incompetence of cervix uteri: Principal | ICD-10-CM

## 2024-08-25 DIAGNOSIS — F32A Depression, unspecified: Secondary | ICD-10-CM | POA: Diagnosis not present

## 2024-08-25 DIAGNOSIS — I10 Essential (primary) hypertension: Secondary | ICD-10-CM | POA: Diagnosis not present

## 2024-08-25 DIAGNOSIS — O3433 Maternal care for cervical incompetence, third trimester: Secondary | ICD-10-CM | POA: Diagnosis present

## 2024-08-25 DIAGNOSIS — Z3A14 14 weeks gestation of pregnancy: Secondary | ICD-10-CM | POA: Insufficient documentation

## 2024-08-25 DIAGNOSIS — O10012 Pre-existing essential hypertension complicating pregnancy, second trimester: Secondary | ICD-10-CM | POA: Insufficient documentation

## 2024-08-25 HISTORY — PX: CERVICAL CERCLAGE: SHX1329

## 2024-08-25 LAB — CBC
HCT: 34.1 % — ABNORMAL LOW (ref 36.0–46.0)
Hemoglobin: 10.9 g/dL — ABNORMAL LOW (ref 12.0–15.0)
MCH: 26.6 pg (ref 26.0–34.0)
MCHC: 32 g/dL (ref 30.0–36.0)
MCV: 83.2 fL (ref 80.0–100.0)
Platelets: 337 K/uL (ref 150–400)
RBC: 4.1 MIL/uL (ref 3.87–5.11)
RDW: 13.5 % (ref 11.5–15.5)
WBC: 7.8 K/uL (ref 4.0–10.5)
nRBC: 0 % (ref 0.0–0.2)

## 2024-08-25 LAB — TYPE AND SCREEN
ABO/RH(D): A POS
Antibody Screen: NEGATIVE

## 2024-08-25 SURGERY — CERCLAGE, CERVIX, VAGINAL APPROACH
Anesthesia: Spinal

## 2024-08-25 MED ORDER — FENTANYL CITRATE (PF) 100 MCG/2ML IJ SOLN
INTRAMUSCULAR | Status: DC | PRN
  Administered 2024-08-25: 50 ug via INTRAVENOUS
  Administered 2024-08-25: 85 ug via INTRAVENOUS

## 2024-08-25 MED ORDER — FENTANYL CITRATE (PF) 100 MCG/2ML IJ SOLN
INTRAMUSCULAR | Status: AC
  Filled 2024-08-25: qty 2

## 2024-08-25 MED ORDER — POVIDONE-IODINE 10 % EX SWAB
2.0000 | Freq: Once | CUTANEOUS | Status: AC
  Administered 2024-08-25: 2 via TOPICAL

## 2024-08-25 MED ORDER — ONDANSETRON HCL 4 MG/2ML IJ SOLN
INTRAMUSCULAR | Status: DC | PRN
Start: 2024-08-25 — End: 2024-08-25
  Administered 2024-08-25: 4 mg via INTRAVENOUS

## 2024-08-25 MED ORDER — SODIUM CHLORIDE 0.9 % IV SOLN: 12.5000 mg | INTRAVENOUS | Status: DC | PRN

## 2024-08-25 MED ORDER — STERILE WATER FOR IRRIGATION IR SOLN
Status: DC | PRN
Start: 2024-08-25 — End: 2024-08-25
  Administered 2024-08-25: 1

## 2024-08-25 MED ORDER — PRENATAL MULTIVITAMIN CH
1.0000 | ORAL_TABLET | Freq: Every day | ORAL | Status: DC
  Filled 2024-08-25: qty 1

## 2024-08-25 MED ORDER — LACTATED RINGERS IV SOLN: INTRAVENOUS | Status: AC

## 2024-08-25 MED ORDER — LACTATED RINGERS IV SOLN: 125.0000 mL/h | INTRAVENOUS | Status: DC

## 2024-08-25 MED ORDER — SODIUM CHLORIDE (PF) 0.9 % IJ SOLN
INTRAMUSCULAR | Status: DC | PRN
  Administered 2024-08-25: 30 mL via VAGINAL

## 2024-08-25 MED ORDER — OXYCODONE HCL 5 MG/5ML PO SOLN: 5.0000 mg | Freq: Once | ORAL | Status: DC | PRN

## 2024-08-25 MED ORDER — ONDANSETRON HCL 4 MG/2ML IJ SOLN
INTRAMUSCULAR | Status: AC
  Filled 2024-08-25: qty 2

## 2024-08-25 MED ORDER — ASPIRIN 81 MG PO TBEC
81.0000 mg | DELAYED_RELEASE_TABLET | Freq: Every day | ORAL | Status: DC
Start: 2024-08-25 — End: 2024-08-26
  Administered 2024-08-25: 81 mg via ORAL
  Filled 2024-08-25 (×3): qty 1

## 2024-08-25 MED ORDER — BUPIVACAINE IN DEXTROSE 0.75-8.25 % IT SOLN
INTRATHECAL | Status: DC | PRN
  Administered 2024-08-25: 1.4 mL via INTRATHECAL

## 2024-08-25 MED ORDER — DOCUSATE SODIUM 100 MG PO CAPS
100.0000 mg | ORAL_CAPSULE | Freq: Every day | ORAL | Status: DC
  Administered 2024-08-25 – 2024-08-26 (×2): 100 mg via ORAL
  Filled 2024-08-25 (×3): qty 1

## 2024-08-25 MED ORDER — OXYCODONE HCL 5 MG PO TABS: 5.0000 mg | ORAL_TABLET | Freq: Once | ORAL | Status: DC | PRN

## 2024-08-25 MED ORDER — SODIUM CHLORIDE (PF) 0.9 % IJ SOLN
Freq: Once | INTRAMUSCULAR | Status: DC
  Filled 2024-08-25: qty 1

## 2024-08-25 MED ORDER — SERTRALINE HCL 50 MG PO TABS
100.0000 mg | ORAL_TABLET | Freq: Every day | ORAL | Status: DC
Start: 2024-08-26 — End: 2024-08-26
  Administered 2024-08-26: 100 mg via ORAL
  Filled 2024-08-25: qty 2
  Filled 2024-08-25: qty 1

## 2024-08-25 MED ORDER — FENTANYL CITRATE (PF) 100 MCG/2ML IJ SOLN
INTRAMUSCULAR | Status: DC | PRN
  Administered 2024-08-25: 15 ug via INTRATHECAL

## 2024-08-25 MED ORDER — CALCIUM CARBONATE ANTACID 500 MG PO CHEW: 2.0000 | CHEWABLE_TABLET | ORAL | Status: DC | PRN

## 2024-08-25 MED ORDER — LABETALOL HCL 200 MG PO TABS
200.0000 mg | ORAL_TABLET | Freq: Two times a day (BID) | ORAL | Status: DC
  Administered 2024-08-25 – 2024-08-26 (×2): 200 mg via ORAL
  Filled 2024-08-25 (×3): qty 1

## 2024-08-25 MED ORDER — AMPHETAMINE-DEXTROAMPHETAMINE 10 MG PO TABS
10.0000 mg | ORAL_TABLET | Freq: Every day | ORAL | Status: DC | PRN
Start: 2024-08-25 — End: 2024-08-25

## 2024-08-25 MED ORDER — ACETAMINOPHEN 325 MG PO TABS
650.0000 mg | ORAL_TABLET | ORAL | Status: DC | PRN
  Administered 2024-08-25 – 2024-08-26 (×2): 650 mg via ORAL
  Filled 2024-08-25 (×2): qty 2

## 2024-08-25 MED ORDER — CHLOROPROCAINE HCL (PF) 3 % IJ SOLN
INTRAMUSCULAR | Status: AC
Start: 2024-08-25 — End: 2024-08-25
  Filled 2024-08-25: qty 20

## 2024-08-25 SURGICAL SUPPLY — 17 items
CANISTER SUCT 3000ML PPV (MISCELLANEOUS) ×1 IMPLANT
GLOVE BIO SURGEON STRL SZ7.5 (GLOVE) ×1 IMPLANT
GLOVE BIOGEL PI IND STRL 7.0 (GLOVE) ×1 IMPLANT
GLOVE BIOGEL PI IND STRL 7.5 (GLOVE) ×1 IMPLANT
GOWN STRL REUS W/TWL LRG LVL3 (GOWN DISPOSABLE) ×2 IMPLANT
MAT PREVALON FULL STRYKER (MISCELLANEOUS) IMPLANT
NS IRRIG 1000ML POUR BTL (IV SOLUTION) ×1 IMPLANT
PACK VAGINAL MINOR WOMEN LF (CUSTOM PROCEDURE TRAY) ×1 IMPLANT
PAD OB MATERNITY 4.3X12.25 (PERSONAL CARE ITEMS) ×1 IMPLANT
PAD PREP 24X48 CUFFED NSTRL (MISCELLANEOUS) ×1 IMPLANT
SUT MERSILENE 5MM BP 1 12 (SUTURE) ×1 IMPLANT
SUT PROLENE 1 CT 1 30 (SUTURE) ×1 IMPLANT
SYR BULB IRRIGATION 50ML (SYRINGE) ×1 IMPLANT
TOWEL OR 17X24 6PK STRL BLUE (TOWEL DISPOSABLE) ×1 IMPLANT
TRAY FOLEY W/BAG SLVR 14FR (SET/KITS/TRAYS/PACK) ×1 IMPLANT
TUBING NON-CON 1/4 X 20 CONN (TUBING) IMPLANT
YANKAUER SUCT BULB TIP NO VENT (SUCTIONS) IMPLANT

## 2024-08-25 NOTE — Anesthesia Postprocedure Evaluation (Signed)
 Anesthesia Post Note  Patient: Jannett Reith  Procedure(s) Performed: CERCLAGE, CERVIX, VAGINAL APPROACH     Patient location during evaluation: PACU Anesthesia Type: Spinal Level of consciousness: awake and alert Pain management: pain level controlled Vital Signs Assessment: post-procedure vital signs reviewed and stable Respiratory status: spontaneous breathing, nonlabored ventilation and respiratory function stable Cardiovascular status: blood pressure returned to baseline and stable Postop Assessment: no apparent nausea or vomiting Anesthetic complications: no   No notable events documented.  Last Vitals:  Vitals:   08/25/24 1715 08/25/24 1730  BP: 125/83   Pulse: 94 89  Resp: 18 13  Temp:    SpO2: 99% 98%    Last Pain:  Vitals:   08/25/24 1715  TempSrc:   PainSc: 0-No pain   Pain Goal:                Epidural/Spinal Function Cutaneous sensation: (P) Tingles (08/25/24 1730), Patient able to flex knees: (P) Yes (08/25/24 1730), Patient able to lift hips off bed: (P) No (08/25/24 1730), Back pain beyond tenderness at insertion site: (P) No (08/25/24 1730), Progressively worsening motor and/or sensory loss: (P) No (08/25/24 1730), Bowel and/or bladder incontinence post epidural: (P) No (08/25/24 1730)  Butler Levander Pinal

## 2024-08-25 NOTE — Anesthesia Preprocedure Evaluation (Addendum)
 Anesthesia Evaluation  Patient identified by MRN, date of birth, ID band Patient awake    Reviewed: Allergy & Precautions, H&P , NPO status , Patient's Chart, lab work & pertinent test results  Airway Mallampati: II  TM Distance: >3 FB Neck ROM: Full    Dental no notable dental hx.    Pulmonary former smoker   Pulmonary exam normal breath sounds clear to auscultation       Cardiovascular hypertension, Normal cardiovascular exam Rhythm:Regular Rate:Normal     Neuro/Psych   Anxiety Depression    negative neurological ROS  negative psych ROS   GI/Hepatic negative GI ROS, Neg liver ROS,,,  Endo/Other  negative endocrine ROSdiabetes    Renal/GU negative Renal ROS  negative genitourinary   Musculoskeletal negative musculoskeletal ROS (+)    Abdominal  (+) + obese  Peds negative pediatric ROS (+)  Hematology negative hematology ROS (+)   Anesthesia Other Findings   Reproductive/Obstetrics (+) Pregnancy                              Anesthesia Physical Anesthesia Plan  ASA: 3  Anesthesia Plan: Spinal   Post-op Pain Management:    Induction:   PONV Risk Score and Plan: Treatment may vary due to age or medical condition  Airway Management Planned: Natural Airway  Additional Equipment:   Intra-op Plan:   Post-operative Plan:   Informed Consent: I have reviewed the patients History and Physical, chart, labs and discussed the procedure including the risks, benefits and alternatives for the proposed anesthesia with the patient or authorized representative who has indicated his/her understanding and acceptance.     Dental advisory given  Plan Discussed with: CRNA  Anesthesia Plan Comments:         Anesthesia Quick Evaluation

## 2024-08-25 NOTE — Transfer of Care (Signed)
 Immediate Anesthesia Transfer of Care Note  Patient: [name]  Procedure(s) Performed: CERCLAGE, CERVIX, VAGINAL APPROACH  Patient Location: PACU  Anesthesia Type:Spinal  Level of Consciousness: awake, alert , and oriented  Airway & Oxygen Therapy: Patient Spontanous Breathing  Post-op Assessment: Report given to RN and Post -op Vital signs reviewed and stable  Post vital signs: Reviewed and stable  Last Vitals:  Vitals Value Taken Time  BP 123/65 08/25/24 16:18  Temp    Pulse 79 08/25/24 16:20  Resp 20 08/25/24 16:20  SpO2 100 % 08/25/24 16:20  Vitals shown include unfiled device data.  Last Pain:  Vitals:   08/25/24 0959  TempSrc: Oral         Complications: No notable events documented.

## 2024-08-25 NOTE — Op Note (Signed)
 Preop Diagnosis: 1.14 6/7wks 2.History of Incompetent Cervix   Postop Diagnosis: 1.14 6/7wks 2.History of Incompetent Cervix   Procedure: Cervical Cerclage PR CERCLAGE CERVIX PREGNANCY VAGINAL [[id number]]   Anesthesia: General   Anesthesiologist: Cleotilde Butler Dade, MD   Attending: Henry Slough, MD   Assistant: Surgical tech, Roseann  Findings: Cervix internal os closed external os FT  Pathology: N/a  Fluids: 900 cc  UOP: 10 cc (voided prior to procedure)  EBL: less than 5 cc  Complications:  Procedure:Then patient was taken to the operating room after the risks, benefits and alternatives discussed with the patient and consent signed and witnessed.  The patient was given a spinal per anesthesia and placed in the dorsal lithotomy position.  The patient was prepped and draped in the usual sterile fashion.  A cervical cerclage stitch was placed using Mersilene and the knot was tied anteriorly on the cervix with a stitch of 1 prolene at the base to help elevate knot if necessary when it comes time for removal.  Clindamycin  douche was performed.  Membranes remained intact and post procedure fetal heart rate was 142.  Sponge, lap and needle count was correct and the patient was transferred to the recovery room in good condition.

## 2024-08-25 NOTE — H&P (Addendum)
 [name] is a 33 y.o. female, G4P0030 at 71 6/7 weeks, presenting for prophylactic cerclage with Dr. Henry.  Seen for initial visit this pregnancy on 08/05/24, with following history:  Hx 2 prior 2nd trimester losses at 16 3/7 weeks by hospital notes and 17.4 weeks, in 2024 and 01/2024 respectively. Had cerclage placed in 2025 by [facility], due to funneling at 15 weeks. 2024--had PROM at 16 weeks during active COVID infection, with fetal demise prior to delivery, hemorrhage. 01/2024--Had PROM at 17 weeks, with spontaneous labor after removal of cerclage, had endometritis and D&C for removal of retained placenta. Multiple risk factors noted--CHTN, on Labetalol  100 mg BID; ? new Type 2 DM, dx last pregnancy, but patient does not agree with that dx, feels was GDM during last pregnancy, no current meds; hx anxiety/depression; BMI 37; HSV 1, with hx genital sore in past.   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   EDC of 02/17/25 was established by US  at 13 6/7 weeks, due to ? LMP.   Additional US  evaluations:  US  08/18/24 showed cervical length 4.6 at that evaluation Patient wants to proceed with cerclage placement.  [id number]:   08/25/22--SAB at 4 weeks, resolved spontaneously 07/17/23--PROM at 15 6/7  weeks during infection with COVID, with loss of pregnancy. 02/07/24--PROM at 17 4/7 weeks, s/p cerclage approx 2 weeks before, with loss of pregnancy, with PP D&C for retained products and endometritis.  Baby with brief signs of life, but still <[redacted] week gestation  Past Medical History:  Diagnosis Date   ADHD    Anxiety    Depression    HSV infection    Hypertension    PCOS (polycystic ovarian syndrome)    Past Surgical History:  Procedure Laterality Date   CERVICAL CERCLAGE Bilateral 01/23/2024   Procedure: CERCLAGE CERVICAL;  Surgeon: Henry Slough, MD;  Location: [facility];  Service: Gynecology;  Laterality: Bilateral;   DILATION AND EVACUATION N/A 02/07/2024   Procedure: DILATATION AND EVACUATION;  Surgeon: Henry Slough, MD;  Location: [facility];  Service: Gynecology;  Laterality: N/A;   SHOULDER SURGERY     Family History: family history includes Diabetes in her maternal grandmother.  Social History:  reports that she has quit smoking. Her smoking use included cigarettes. She has never used smokeless tobacco. She reports that she does not currently use alcohol. She reports that she does not currently use drugs.   ROS:  Denies bleeding, cramping, or any other sx.  No Known Allergies     Last menstrual period 10/07/2023, not currently breastfeeding.  Chest clear Heart RRR without murmur Abd gravid, NT, FH 15 weeks Pelvic: cervix closed/long on exam 9/3, slightly soft Ext: WNL  FHR: 154 on 08/18/24   Prenatal labs: done 08/05/24  ABO, Rh: --/--/A POS (02/18 2230) Antibody: NEG (02/18 2230) Rubella:  Immune RPR:   NR HBsAg:   Neg HIV:   NR GBS:  BA Sickle cell/Hgb electrophoresis:  AA Pap:  2022 WNL GC:  Neg Chlamydia:  Neg Genetic screenings:  Panorama low risk, Horizon positive alpha thal trait Glucola:  NA Other:   Hgb 11.9 at NOB    Assessment/Plan: IUP at 14 6/7 weeks Hx incompetent cervix Hx recurrent 2nd trimester loss x 2 (PROM x 2) Prior  cerclage last pregnancy Chronic HTN --on Labetalol  100 mg BID Anxiety/depression Hx HSV--no current issues Elevated Hgb A1C on recent testing (6.1)--early glucola scheduled at [place].  Plan: Admit for outpatient cerclage placement per Dr. Henry Routine [facility] pre-op orders Support to patient for concerns regarding pregnancy.  Orie Bonus, CNM, [place] 08/25/2024, 5:43 AM  Risks benefits and alternatives reviewed including but not limited to bleeding infection injury rupture of membranes and possible loss of the pregnancy.  Questions answered and consent signed and witnessed.

## 2024-08-25 NOTE — Anesthesia Procedure Notes (Signed)
 Spinal  Patient location during procedure: OB Start time: 08/25/2024 3:22 PM End time: 08/25/2024 3:27 PM Reason for block: surgical anesthesia Staffing Performed: anesthesiologist  Anesthesiologist: Cleotilde Butler Dade, MD Performed by: Cleotilde Butler Dade, MD Authorized by: Cleotilde Butler Dade, MD   Preanesthetic Checklist Completed: patient identified, IV checked, risks and benefits discussed, surgical consent, monitors and equipment checked, pre-op evaluation and timeout performed Spinal Block Patient position: sitting Prep: DuraPrep and site prepped and draped Patient monitoring: heart rate, cardiac monitor, continuous pulse ox and blood pressure Approach: midline Location: L3-4 Injection technique: single-shot Needle Needle type: Pencan  Needle gauge: 24 G Needle length: 10 cm Assessment Sensory level: T4 Events: CSF return

## 2024-08-26 DIAGNOSIS — O3432 Maternal care for cervical incompetence, second trimester: Secondary | ICD-10-CM | POA: Diagnosis not present

## 2024-08-26 MED ORDER — ASPIRIN 81 MG PO TBEC: 81.0000 mg | DELAYED_RELEASE_TABLET | Freq: Every day | ORAL | 12 refills | Status: AC

## 2024-08-26 MED ORDER — PRENATAL MULTIVITAMIN CH: 1.0000 | ORAL_TABLET | Freq: Every day | ORAL | 1 refills | Status: AC

## 2024-08-26 MED ORDER — DOCUSATE SODIUM 100 MG PO CAPS: 100.0000 mg | ORAL_CAPSULE | Freq: Every day | ORAL | 0 refills | Status: DC

## 2024-08-26 MED ORDER — AMPHETAMINE-DEXTROAMPHETAMINE 10 MG PO TABS
10.0000 mg | ORAL_TABLET | Freq: Every day | ORAL | Status: DC
  Administered 2024-08-26: 10 mg via ORAL
  Filled 2024-08-26: qty 1

## 2024-08-26 MED ORDER — ACETAMINOPHEN 325 MG PO TABS: 650.0000 mg | ORAL_TABLET | ORAL | 0 refills | Status: DC | PRN

## 2024-08-26 MED ORDER — AMPHETAMINE-DEXTROAMPHETAMINE 10 MG PO TABS: 10.0000 mg | ORAL_TABLET | Freq: Every day | ORAL | 0 refills | Status: AC

## 2024-08-26 MED ORDER — SERTRALINE HCL 100 MG PO TABS: 100.0000 mg | ORAL_TABLET | Freq: Every day | ORAL | 0 refills | Status: AC

## 2024-08-26 MED ORDER — LABETALOL HCL 200 MG PO TABS: 200.0000 mg | ORAL_TABLET | Freq: Two times a day (BID) | ORAL | 1 refills | Status: DC

## 2024-08-26 NOTE — Discharge Summary (Signed)
 Physician Discharge Summary  Patient ID: Tammy [name] MRN: [id number] DOB/AGE: 1991/03/18 33 y.o.  Admit date: 08/25/2024 Discharge date: 08/26/2024  Admission Diagnoses:  Discharge Diagnoses:  Principal Problem:   Pregnancy Active Problems:   Cervical insufficiency during pregnancy in third trimester, antepartum   Discharged Condition: good  Hospital Course: pt came in for cerclage due to incompentent cervix.  She was unable to walk.  She can walk and void and is able to go home  Consults: None  Significant Diagnostic Studies:   Treatments: IV hydration and surgery: cerclage  Discharge Exam: Blood pressure (!) 120/49, pulse 86, temperature 98.3 F (36.8 C), temperature source Oral, resp. rate 17, height 5' 8 (1.727 m), weight 112 kg, last menstrual period 10/07/2023, SpO2 99%, not currently breastfeeding. General appearance: alert and cooperative GI: soft, non-tender; bowel sounds normal; no masses,  no organomegaly Pelvic: scant VB Extremities: Homans sign is negative, no sign of DVT  Disposition: Discharge disposition: 01-Home or Self Care       Discharge Instructions     Discharge patient   Complete by: As directed    Per protocol   Discharge disposition: 01-Home or Self Care   Discharge patient date: 08/25/2024      Allergies as of 08/26/2024   No Known Allergies      Medication List     TAKE these medications    acetaminophen  325 MG tablet Commonly known as: TYLENOL  Take 2 tablets (650 mg total) by mouth every 4 (four) hours as needed (for pain scale < 4  OR  temperature  >/=  100.5 F).   Adderall 10 MG tablet Generic drug: amphetamine -dextroamphetamine  Take 10 mg by mouth daily as needed (Focus). What changed: Another medication with the same name was added. Make sure you understand how and when to take each.   amphetamine -dextroamphetamine  10 MG tablet Commonly known as: ADDERALL Take 1 tablet (10 mg total) by mouth daily with  breakfast. Start taking on: August 27, 2024 What changed: You were already taking a medication with the same name, and this prescription was added. Make sure you understand how and when to take each.   aspirin  EC 81 MG tablet Take 81 mg by mouth daily. Swallow whole. What changed: Another medication with the same name was added. Make sure you understand how and when to take each.   aspirin  EC 81 MG tablet Take 1 tablet (81 mg total) by mouth daily. Swallow whole. What changed: You were already taking a medication with the same name, and this prescription was added. Make sure you understand how and when to take each.   cholecalciferol 25 MCG (1000 UNIT) tablet Commonly known as: VITAMIN D3 Take 1,000 Units by mouth daily.   docusate sodium  100 MG capsule Commonly known as: COLACE Take 1 capsule (100 mg total) by mouth daily.   labetalol  200 MG tablet Commonly known as: NORMODYNE  Take 200 mg by mouth 2 (two) times daily. What changed: Another medication with the same name was added. Make sure you understand how and when to take each.   labetalol  200 MG tablet Commonly known as: NORMODYNE  Take 1 tablet (200 mg total) by mouth 2 (two) times daily. What changed: You were already taking a medication with the same name, and this prescription was added. Make sure you understand how and when to take each.   prenatal multivitamin Tabs tablet Take 1 tablet by mouth daily. What changed: Another medication with the same name was added. Make sure you understand  how and when to take each.   prenatal multivitamin Tabs tablet Take 1 tablet by mouth daily at 12 noon. What changed: You were already taking a medication with the same name, and this prescription was added. Make sure you understand how and when to take each.   sertraline  100 MG tablet Commonly known as: ZOLOFT  Take 100 mg by mouth daily. What changed: Another medication with the same name was added. Make sure you understand how  and when to take each.   sertraline  100 MG tablet Commonly known as: ZOLOFT  Take 1 tablet (100 mg total) by mouth daily. What changed: You were already taking a medication with the same name, and this prescription was added. Make sure you understand how and when to take each.        Follow-up Information     [facility]. Go on 09/08/2024.   Specialty: Obstetrics and Gynecology Why: at 1pm for routine OB visit Contact information: [address] [place]  [address] [phone]        Henry Slough, MD. Go on 09/14/2024.   Specialty: Obstetrics and Gynecology Why: at 9:00am for OB visit and follow up cerclage Contact information: [address] [place] [address] [phone]                 Signed: Ovid LABOR Xian Alves 08/26/2024, 9:51 AM

## 2024-09-26 ENCOUNTER — Ambulatory Visit
Admission: EM | Admit: 2024-09-26 | Discharge: 2024-09-26 | Disposition: A | Discharge status: Home / Self Care | Attending: Internal Medicine | Admitting: Internal Medicine

## 2024-09-26 ENCOUNTER — Encounter: Payer: Self-pay | Admitting: Emergency Medicine

## 2024-09-26 DIAGNOSIS — O2313 Infections of bladder in pregnancy, third trimester: Secondary | ICD-10-CM | POA: Diagnosis not present

## 2024-09-26 DIAGNOSIS — Z3A19 19 weeks gestation of pregnancy: Secondary | ICD-10-CM | POA: Diagnosis not present

## 2024-09-26 LAB — POCT URINE DIPSTICK
Bilirubin, UA: NEGATIVE
Glucose, UA: NEGATIVE mg/dL
Nitrite, UA: NEGATIVE
POC PROTEIN,UA: 30 — AB
Spec Grav, UA: >=1.03 — AB (ref 1.010–1.025)
Urobilinogen, UA: 0.2 U/dL
pH, UA: 6 (ref 5.0–8.0)

## 2024-09-26 MED ORDER — CEPHALEXIN 500 MG PO CAPS: 500.0000 mg | ORAL_CAPSULE | Freq: Two times a day (BID) | ORAL | 0 refills | Status: DC

## 2024-09-26 NOTE — ED Triage Notes (Addendum)
 Pt c/o stinging at urethra, pressure in pelvis, and having to go to the bathroom more often for 2 days.  Pt denies any vaginal discharge.   She is [redacted]weeks pregnant and had cerclage on 9/10

## 2024-09-26 NOTE — Discharge Instructions (Signed)
 Your urine shows you likely have a urinary tract infection.   I have sent your urine for culture to confirm this.   We will call you if we need to change your antibiotic when we find out the type of bacteria growing in your bladder.  Take antibiotic as directed with a snack/food to avoid stomach upset. To avoid GI upset please take this medication with food.   Avoid drinking beverages that irritate the urinary tract like sodas, tea, coffee, or juice. Drink plenty of water  to stay well hydrated and prevent severe infection.  If you develop pain in your upper back on one side, fever despite taking antibiotic, nausea and vomiting where you cannot keep anything down for 24 hours, dizziness/severe headache, or decreased urinary output, please go to the ER. Follow-up with PCP.

## 2024-09-26 NOTE — ED Provider Notes (Signed)
 [facility]    CSN: [id number] Arrival date & time: 09/26/24  1545      History   Chief Complaint Chief Complaint  Patient presents with   Urinary Frequency    HPI Tammy [name] is a 33 y.o. female.   Tammy [name] is a 33 y.o. female who is currently [redacted] weeks pregnant with cerclage in place presenting for chief complaint of Urinary Frequency, bladder pressure, and stinging to the external urethra that started 2-3 days ago. She's had this type of presentation/pain in the past when she has had a UTI. Denies urinary urgency, flank pain, low back pain, fever/chills, nausea, vomiting, diarrhea, abdominal pain, dizziness, and headaches. Denies vaginal bleeding. She's not able to feel baby move much quite yet but states what she is feeling has not been decreased or abnormal.  Denies recent antibiotic/steroid use. History of HSV2, she has not seen rash to the vaginal region and states this does not feel like an HSV outbreak. Denies vaginal discharge, vaginal odor, and vaginal itching. She has not attempted use of any OTC medications to help with symptoms PTA.    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   Preterm premature rupture of membranes (PPROM) delivered, current hospitalization 02/03/2024   DM (diabetes mellitus) (HCC) 12/25/2023   Obesity affecting pregnancy, antepartum 12/25/2023   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 12/25/2023   Lab test positive for detection of COVID-19 virus 07/19/2023   Acute blood loss anemia (ABLA) 07/19/2023    Premature rupture of membranes 07/17/2023   IUFD at less than 20 weeks of gestation 07/17/2023   Chronic hypertension affecting pregnancy 08/25/2022    Past Surgical History:  Procedure Laterality Date   CERVICAL CERCLAGE Bilateral 01/23/2024   Procedure: CERCLAGE CERVICAL;  Surgeon: Henry Slough, MD;  Location: [facility];  Service: Gynecology;  Laterality: Bilateral;   CERVICAL CERCLAGE N/A 08/25/2024   Procedure: CERCLAGE, CERVIX, VAGINAL APPROACH;  Surgeon: Henry Slough, MD;  Location: [facility];  Service: Obstetrics;  Laterality: N/A;   DILATION AND EVACUATION N/A 02/07/2024   Procedure: DILATATION AND EVACUATION;  Surgeon: Henry Slough, MD;  Location: [facility];  Service: Gynecology;  Laterality: N/A;   SHOULDER SURGERY      OB History     Gravida  4   Para  1   Term  0   Preterm  1   AB  2   Living  0      SAB  2   IAB  0   Ectopic  0   Multiple  0   Live Births  0            Home Medications    Prior to Admission medications   Medication Sig Start Date End Date Taking? Authorizing Provider  acetaminophen  (TYLENOL ) 500 MG tablet Take 2 tablets (1,000 mg total) by mouth every 6 (six) hours as needed for moderate pain (pain score 4-6). 09/30/24   Danny Geralds, DO  amphetamine -dextroamphetamine  (ADDERALL) 10 MG tablet Take 1 tablet (10 mg  total) by mouth daily with breakfast. 08/27/24   Armond Cape, MD  aspirin  EC 81 MG tablet Take 1 tablet (81 mg total) by mouth daily. Swallow whole. 08/26/24   Armond Cape, MD  labetalol  (NORMODYNE ) 200 MG tablet Take 1 tablet (200 mg total) by mouth 2 (two) times daily. 08/26/24   Armond Cape, MD  Prenatal Vit-Fe Fumarate-FA (PRENATAL MULTIVITAMIN) TABS tablet Take 1 tablet by mouth daily at 12 noon. 08/26/24   Dillard, Cape, MD  senna-docusate (SENOKOT-S) 8.6-50 MG tablet Take 2 tablets by mouth at bedtime as needed for mild constipation or moderate constipation. 09/30/24   Danny Geralds, DO  sertraline  (ZOLOFT )  100 MG tablet Take 1 tablet (100 mg total) by mouth daily. 08/26/24   Dillard, Naima, MD  sodium phosphate (FLEET) ENEM Place 133 mLs (1 enema total) rectally once as needed for up to 1 dose for severe constipation. 09/30/24   Danny Geralds, DO    Family History Family History  Problem Relation Age of Onset   Diabetes Maternal Grandmother     Social History Social History   Tobacco Use   Smoking status: Former    Types: Cigarettes   Smokeless tobacco: Never  Vaping Use   Vaping status: Former  Substance Use Topics   Alcohol use: Not Currently    Comment: weekly   Drug use: Not Currently     Allergies   Patient has no known allergies.   Review of Systems Review of Systems  Genitourinary:  Positive for frequency.  Per HPI   Physical Exam Triage Vital Signs ED Triage Vitals  Encounter Vitals Group     BP 09/26/24 1601 127/77     Girls Systolic BP Percentile --      Girls Diastolic BP Percentile --      Boys Systolic BP Percentile --      Boys Diastolic BP Percentile --      Pulse Rate 09/26/24 1601 97     Resp 09/26/24 1601 17     Temp 09/26/24 1601 98.2 F (36.8 C)     Temp Source 09/26/24 1601 Oral     SpO2 09/26/24 1601 95 %     Weight --      Height --      Head Circumference --      Peak Flow --      Pain Score 09/26/24 1603 0     Pain Loc --      Pain Education --      Exclude from Growth Chart --    No data found.  Updated Vital Signs BP 127/77 (BP Location: Right Arm)   Pulse 97   Temp 98.2 F (36.8 C) (Oral)   Resp 17   LMP 10/07/2023   SpO2 95%   Visual Acuity Right Eye Distance:   Left Eye Distance:   Bilateral Distance:    Right Eye Near:   Left Eye Near:    Bilateral Near:     Physical Exam Vitals and nursing note reviewed.  Constitutional:      Appearance: She is not ill-appearing or toxic-appearing.  HENT:     Head: Normocephalic and atraumatic.     Right Ear: Hearing and external ear normal.     Left Ear: Hearing and  external ear normal.     Nose: Nose normal.     Mouth/Throat:     Lips: Pink.  Eyes:     General: Lids are normal. Vision grossly intact. Gaze aligned appropriately.  Extraocular Movements: Extraocular movements intact.     Conjunctiva/sclera: Conjunctivae normal.  Pulmonary:     Effort: Pulmonary effort is normal.  Abdominal:     General: Bowel sounds are normal.     Palpations: Abdomen is soft.     Tenderness: There is no abdominal tenderness. There is no right CVA tenderness, left CVA tenderness or guarding.     Comments: Gravid abdomen. Non-tender.   Musculoskeletal:     Cervical back: Neck supple.  Skin:    General: Skin is warm and dry.     Capillary Refill: Capillary refill takes less than 2 seconds.     Findings: No rash.  Neurological:     General: No focal deficit present.     Mental Status: She is alert and oriented to person, place, and time. Mental status is at baseline.     Cranial Nerves: No dysarthria or facial asymmetry.  Psychiatric:        Mood and Affect: Mood normal.        Speech: Speech normal.        Behavior: Behavior normal.        Thought Content: Thought content normal.        Judgment: Judgment normal.      UC Treatments / Results  Labs (all labs ordered are listed, but only abnormal results are displayed) Labs Reviewed  POCT URINE DIPSTICK - Abnormal; Notable for the following components:      Result Value   Ketones, POC UA trace (5) (*)    Spec Grav, UA >=1.030 (*)    Blood, UA trace-intact (*)    POC PROTEIN,UA =30 (*)    Leukocytes, UA Trace (*)    All other components within normal limits  URINE CULTURE    EKG   Radiology No results found.  Procedures Procedures (including critical care time)  Medications Ordered in UC Medications - No data to display  Initial Impression / Assessment and Plan / UC Course  I have reviewed the triage vital signs and the nursing notes.  Pertinent labs & imaging results that were  available during my care of the patient were reviewed by me and considered in my medical decision making (see chart for details).   1. Acute cystitis in pregnancy, [redacted] weeks gestation pregnancy Evaluation suggests acute cystitis based on presentation and urinalysis findings in clinic.  Urine culture pending.  Low suspicion for acute pyelonephritis, kidney stone or infected stone.  Keflex antibiotic ordered. Appears well hydrated, therefore will defer labs/imaging.  Vitals stable.  Patient to push fluids to stay well hydrated and reduce intake of known urinary irritants.   No red flags related to pregnancy.   Counseled patient on potential for adverse effects with medications prescribed/recommended today, strict ER and return-to-clinic precautions discussed, patient verbalized understanding.    Final Clinical Impressions(s) / UC Diagnoses   Final diagnoses:  Acute cystitis in pregnancy, antepartum, third trimester     Discharge Instructions      Your urine shows you likely have a urinary tract infection.   I have sent your urine for culture to confirm this.   We will call you if we need to change your antibiotic when we find out the type of bacteria growing in your bladder.  Take antibiotic as directed with a snack/food to avoid stomach upset. To avoid GI upset please take this medication with food.   Avoid drinking beverages that irritate the urinary tract like sodas, tea, coffee, or juice. Drink  plenty of water  to stay well hydrated and prevent severe infection.  If you develop pain in your upper back on one side, fever despite taking antibiotic, nausea and vomiting where you cannot keep anything down for 24 hours, dizziness/severe headache, or decreased urinary output, please go to the ER. Follow-up with PCP.       ED Prescriptions     Medication Sig Dispense Auth. Provider   cephALEXin (KEFLEX) 500 MG capsule Take 1 capsule (500 mg total) by mouth 2 (two) times daily  for 7 days. 14 capsule Enedelia Dorna HERO, FNP      PDMP not reviewed this encounter.   Enedelia Dorna HERO, [place] 10/03/24 1517

## 2024-09-27 ENCOUNTER — Other Ambulatory Visit

## 2024-09-27 ENCOUNTER — Ambulatory Visit

## 2024-09-28 LAB — URINE CULTURE: Culture: NO GROWTH

## 2024-09-29 ENCOUNTER — Ambulatory Visit (HOSPITAL_COMMUNITY): Payer: Self-pay

## 2024-09-30 ENCOUNTER — Other Ambulatory Visit: Payer: Self-pay

## 2024-09-30 ENCOUNTER — Encounter (HOSPITAL_COMMUNITY): Payer: Self-pay | Admitting: Obstetrics and Gynecology

## 2024-09-30 ENCOUNTER — Inpatient Hospital Stay (HOSPITAL_COMMUNITY)
Admission: AD | Admit: 2024-09-30 | Discharge: 2024-09-30 | Disposition: A | Discharge status: Home / Self Care | Attending: Obstetrics and Gynecology | Admitting: Obstetrics and Gynecology

## 2024-09-30 DIAGNOSIS — R102 Pelvic and perineal pain unspecified side: Secondary | ICD-10-CM | POA: Diagnosis not present

## 2024-09-30 DIAGNOSIS — O3432 Maternal care for cervical incompetence, second trimester: Secondary | ICD-10-CM | POA: Diagnosis not present

## 2024-09-30 DIAGNOSIS — R319 Hematuria, unspecified: Secondary | ICD-10-CM | POA: Insufficient documentation

## 2024-09-30 DIAGNOSIS — R350 Frequency of micturition: Secondary | ICD-10-CM | POA: Diagnosis present

## 2024-09-30 DIAGNOSIS — O26892 Other specified pregnancy related conditions, second trimester: Secondary | ICD-10-CM | POA: Insufficient documentation

## 2024-09-30 DIAGNOSIS — O99891 Other specified diseases and conditions complicating pregnancy: Secondary | ICD-10-CM | POA: Insufficient documentation

## 2024-09-30 DIAGNOSIS — Z3A20 20 weeks gestation of pregnancy: Secondary | ICD-10-CM | POA: Diagnosis not present

## 2024-09-30 LAB — URINALYSIS, ROUTINE W REFLEX MICROSCOPIC
Bacteria, UA: NONE SEEN
Bilirubin Urine: NEGATIVE
Glucose, UA: NEGATIVE mg/dL
Ketones, ur: NEGATIVE mg/dL
Leukocytes,Ua: NEGATIVE
Nitrite: NEGATIVE
Protein, ur: NEGATIVE mg/dL
RBC / HPF: >50 RBC/hpf (ref 0–5)
Specific Gravity, Urine: 1.021 (ref 1.005–1.030)
pH: 6 (ref 5.0–8.0)

## 2024-09-30 LAB — WET PREP, GENITAL
Clue Cells Wet Prep HPF POC: NONE SEEN
Sperm: NONE SEEN
Trich, Wet Prep: NONE SEEN
WBC, Wet Prep HPF POC: >=10 — AB (ref <10)
Yeast Wet Prep HPF POC: NONE SEEN

## 2024-09-30 LAB — RUPTURE OF MEMBRANE (ROM)PLUS: Rom Plus: NEGATIVE

## 2024-09-30 MED ORDER — ACETAMINOPHEN 500 MG PO TABS: 1000.0000 mg | ORAL_TABLET | Freq: Four times a day (QID) | ORAL | 0 refills | Status: AC | PRN

## 2024-09-30 MED ORDER — FLEET ENEMA RE ENEM: 1.0000 | ENEMA | Freq: Once | RECTAL | 0 refills | Status: AC | PRN

## 2024-09-30 MED ORDER — SENNOSIDES-DOCUSATE SODIUM 8.6-50 MG PO TABS: 2.0000 | ORAL_TABLET | Freq: Every evening | ORAL | 0 refills | Status: AC | PRN

## 2024-09-30 NOTE — Discharge Instructions (Addendum)
 Return to the MAU if your symptoms worsen, you develop abdominal, or bleeding - moving your body helps move your gut! Walking is great exercise, even just 15-33min at a time                    Safe Medications in Pregnancy    Acne: Benzoyl Peroxide Salicylic Acid  Backache/Headache: Tylenol : 2 regular strength every 4 hours OR              2 Extra strength every 6 hours  Colds/Coughs/Allergies: Benadryl  (alcohol free) 25 mg every 6 hours as needed Breath right strips Claritin Cepacol throat lozenges Chloraseptic throat spray Cold-Eeze- up to three times per day Cough drops, alcohol free Flonase (by prescription only) Guaifenesin Mucinex Robitussin DM (plain only, alcohol free) Saline nasal spray/drops Sudafed (pseudoephedrine) & Actifed ** use only after [redacted] weeks gestation and if you do not have high blood pressure Tylenol  Vicks Vaporub Zinc lozenges Zyrtec   Constipation: Colace Ducolax suppositories Fleet enema Glycerin  suppositories Metamucil Milk of magnesia Miralax  Senokot Smooth move tea  Diarrhea: Kaopectate Imodium  A-D  *NO pepto Bismol  Hemorrhoids: Anusol Anusol HC Preparation H Tucks  Indigestion: Tums Maalox Mylanta Zantac  Pepcid  Insomnia: Benadryl  (alcohol free) 25mg  every 6 hours as needed Tylenol  PM Unisom, no Gelcaps  Leg Cramps: Tums MagGel  Nausea/Vomiting:  Bonine Dramamine Emetrol Ginger extract Sea bands Meclizine  Nausea medication to take during pregnancy:  Unisom (doxylamine succinate 25 mg tablets) Take one tablet daily at bedtime. If symptoms are not adequately controlled, the dose can be increased to a maximum recommended dose of two tablets daily (1/2 tablet in the morning, 1/2 tablet mid-afternoon and one at bedtime). Vitamin B6 100mg  tablets. Take one tablet twice a day (up to 200 mg per day).  Skin Rashes: Aveeno products Benadryl  cream or 25mg  every 6 hours as needed Calamine Lotion 1% cortisone  cream  Yeast infection: Gyne-lotrimin 7 Monistat 7   **If taking multiple medications, please check labels to avoid duplicating the same active ingredients **take medication as directed on the label ** Do not exceed 4000 mg of tylenol  in 24 hours **Do not take medications that contain aspirin  or ibuprofen 

## 2024-09-30 NOTE — MAU Note (Signed)
.  Tammy [name] is a 33 y.o. at [redacted]w[redacted]d here in MAU reporting: vaginal burring, pelvic pressure, and urinary frequency. She was seen in UC on 10/12 and was proscribed antibiotic and was told she had an UTI. She was informed by the culture results that it wasn't an UTI so she stopped taking the antibiotic but still having symptoms.    Denies vaginal bleeding or LOF  Onset of complaint: 09/26/24 Pain score: 4/10 Vitals:   09/30/24 1435  BP: 134/64  Pulse: 98  Resp: 17  Temp: 98.2 F (36.8 C)      Lab orders placed from triage: UA

## 2024-09-30 NOTE — MAU Provider Note (Signed)
 History     CSN: [id number]  Arrival date and time: 09/30/24 1333 First Provider Initiated Contact with Patient   Chief Complaint  Patient presents with   Urinary Frequency    HPI Tammy [name] is a 33 y.o. [id number] at [redacted]w[redacted]d, 02/17/2025, by Other Basis, who presents to the Maternity Assessment Unit for pelvic pressure. Patient reports persistent pressure and discomfort in the vulva/pelvis. She reports frequent urinary urge with little/no output, urination is painful when it occurs. She says she feels like she has a UTI.   She was seen at [facility] 4d ago and given empiric abx which she stopped when she saw result of negative urine culture.   ROS Contractions: No  Fetal Movement: Yes  Vaginal bleeding: No  LOF: No    Medications Prior to Admission  Medication Sig Dispense Refill Last Dose/Taking   aspirin  EC 81 MG tablet Take 1 tablet (81 mg total) by mouth daily. Swallow whole. 30 tablet 12 09/30/2024   Prenatal Vit-Fe Fumarate-FA (PRENATAL MULTIVITAMIN) TABS tablet Take 1 tablet by mouth daily at 12 noon. 90 tablet 1 09/30/2024   sertraline  (ZOLOFT ) 100 MG tablet Take 1 tablet (100 mg total) by mouth daily. 30 tablet 0 09/30/2024   acetaminophen  (TYLENOL ) 325 MG tablet Take 2 tablets (650 mg total) by mouth every 4 (four) hours as needed (for pain scale < 4  OR  temperature  >/=  100.5 F). 30 tablet 0    amphetamine -dextroamphetamine  (ADDERALL) 10 MG tablet Take 1 tablet (10 mg total) by mouth daily with breakfast. 20 tablet 0    cephALEXin (KEFLEX) 500 MG capsule Take 1 capsule (500 mg total) by mouth 2 (two) times daily for 7 days. 14 capsule 0    docusate sodium  (COLACE) 100 MG capsule Take 1 capsule (100 mg total) by mouth daily. 10 capsule 0    labetalol  (NORMODYNE ) 200 MG tablet Take 1 tablet (200 mg total) by mouth 2 (two) times daily. 30 tablet 1     Past Medical History:  Diagnosis Date   ADHD    Anxiety    Depression    HSV infection    Hypertension    PCOS (polycystic  ovarian syndrome)     Past Surgical History:  Procedure Laterality Date   CERVICAL CERCLAGE Bilateral 01/23/2024   Procedure: CERCLAGE CERVICAL;  Surgeon: Henry Slough, MD;  [facility];  Service: Gynecology;  Laterality: Bilateral;   CERVICAL CERCLAGE N/A 08/25/2024   Procedure: CERCLAGE, CERVIX, VAGINAL APPROACH;  Surgeon: Henry Slough, MD;  [facility]: [facility];  Service: Obstetrics;  Laterality: N/A;   DILATION AND EVACUATION N/A 02/07/2024   Procedure: DILATATION AND EVACUATION;  Surgeon: Henry Slough, MD;  Location: [facility];  Service: Gynecology;  Laterality: N/A;   SHOULDER SURGERY       Allergies: No Known Allergies  ROS reviewed and pertinent positives and negatives as documented in HPI.    Physical Exam  BP 134/64 (BP Location: Right Arm)   Pulse 98   Temp 98.2 F (36.8 C) (Oral)   Resp 17   LMP 10/07/2023   Gen: alert, no acute distress CV: regular rate Resp: nonlabored Abd: tender in lower quadrants without guarding or rebound MSK: CVA tenderness (-)  SSE: thin white fluid in the vagina, multiparous cervix appears closed and thick, no bleeding, no prolapsing membranes, cerclage suture marginally visualized anteriorly. Swabs  collected. Chaperone present.  Cervical Exam   N/A  FHT N/A   Labs --/--/A POS (09/10 1050)  Results for orders placed or performed during the hospital encounter of 09/30/24 (from the past 24 hours)  Urinalysis, Routine w reflex microscopic -Urine, Clean Catch     Status: Abnormal   Collection Time: 09/30/24  2:41 PM  Result Value Ref Range   Color, Urine YELLOW YELLOW   APPearance HAZY (A) CLEAR   Specific Gravity, Urine 1.021 1.005 - 1.030   pH 6.0 5.0 - 8.0   Glucose, UA NEGATIVE NEGATIVE mg/dL   Hgb urine dipstick MODERATE (A) NEGATIVE   Bilirubin Urine NEGATIVE NEGATIVE   Ketones, ur NEGATIVE NEGATIVE mg/dL   Protein, ur NEGATIVE NEGATIVE mg/dL   Nitrite NEGATIVE NEGATIVE   Leukocytes,Ua NEGATIVE NEGATIVE    RBC / HPF >50 0 - 5 RBC/hpf   WBC, UA 0-5 0 - 5 WBC/hpf   Bacteria, UA NONE SEEN NONE SEEN   Squamous Epithelial / HPF 0-5 0 - 5 /HPF   Mucus PRESENT    Ca Oxalate Crys, UA PRESENT   Wet prep, genital     Status: Abnormal   Collection Time: 09/30/24  4:20 PM   Specimen: Vaginal  Result Value Ref Range   Yeast Wet Prep HPF POC NONE SEEN NONE SEEN   Trich, Wet Prep NONE SEEN NONE SEEN   Clue Cells Wet Prep HPF POC NONE SEEN NONE SEEN   WBC, Wet Prep HPF POC >=10 (A) <10   Sperm NONE SEEN   Rupture of Membrane (ROM) Plus     Status: None   Collection Time: 09/30/24  4:20 PM  Result Value Ref Range   Rom Plus NEGATIVE      Assessment and Plan  MDM Tammy [name] is a 33 y.o. [id number] at [redacted]w[redacted]d, 02/17/2025, by Other Basis, who presents to the MAU for pelvic pressure. Ddx: PTL, vaginitis, UTI, nephrolithiasis, constipation, round ligament pain. ROM+ negative. Wet prep negative. Cervix appears normal. Discussed enema in MAU vs at home, rx sent, patient will do at home. Discussed with [facility] on-call who will assure close follow up for this patient given her h/o 2nd trimester sAb and PTB.     ICD-10-CM   1. Cervical cerclage suture present in second trimester  O34.32     2. Pelvic pressure in pregnancy  O26.899 Discharge patient   R10.20     3. Hematuria, unspecified type  R31.9        Results pending at the time of DC: GC/CT Dispo: DC home in stable condition with return precautions discussed and included in AVS.    Barabara Maier, DO FMOB Fellow, Faculty Practice [facility], Center for [facility]

## 2024-10-01 LAB — GC/CHLAMYDIA PROBE AMP (~~LOC~~) NOT AT ARMC
Chlamydia: NEGATIVE
Comment: NEGATIVE
Comment: NORMAL
Neisseria Gonorrhea: NEGATIVE

## 2024-10-11 DIAGNOSIS — O3432 Maternal care for cervical incompetence, second trimester: Secondary | ICD-10-CM | POA: Insufficient documentation

## 2024-10-11 DIAGNOSIS — O24119 Pre-existing diabetes mellitus, type 2, in pregnancy, unspecified trimester: Secondary | ICD-10-CM | POA: Insufficient documentation

## 2024-10-12 ENCOUNTER — Other Ambulatory Visit: Payer: Self-pay | Admitting: *Deleted

## 2024-10-12 ENCOUNTER — Ambulatory Visit: Attending: Obstetrics and Gynecology | Admitting: Obstetrics

## 2024-10-12 ENCOUNTER — Ambulatory Visit

## 2024-10-12 ENCOUNTER — Other Ambulatory Visit: Payer: Self-pay | Admitting: Obstetrics and Gynecology

## 2024-10-12 VITALS — BP 114/72

## 2024-10-12 DIAGNOSIS — O09292 Supervision of pregnancy with other poor reproductive or obstetric history, second trimester: Secondary | ICD-10-CM

## 2024-10-12 DIAGNOSIS — O99212 Obesity complicating pregnancy, second trimester: Secondary | ICD-10-CM | POA: Insufficient documentation

## 2024-10-12 DIAGNOSIS — O283 Abnormal ultrasonic finding on antenatal screening of mother: Secondary | ICD-10-CM | POA: Insufficient documentation

## 2024-10-12 DIAGNOSIS — Z79890 Hormone replacement therapy (postmenopausal): Secondary | ICD-10-CM | POA: Diagnosis not present

## 2024-10-12 DIAGNOSIS — Z148 Genetic carrier of other disease: Secondary | ICD-10-CM | POA: Diagnosis not present

## 2024-10-12 DIAGNOSIS — O3432 Maternal care for cervical incompetence, second trimester: Secondary | ICD-10-CM

## 2024-10-12 DIAGNOSIS — O10012 Pre-existing essential hypertension complicating pregnancy, second trimester: Secondary | ICD-10-CM

## 2024-10-12 DIAGNOSIS — O10912 Unspecified pre-existing hypertension complicating pregnancy, second trimester: Secondary | ICD-10-CM | POA: Diagnosis not present

## 2024-10-12 DIAGNOSIS — O2622 Pregnancy care for patient with recurrent pregnancy loss, second trimester: Secondary | ICD-10-CM | POA: Insufficient documentation

## 2024-10-12 DIAGNOSIS — Z8759 Personal history of other complications of pregnancy, childbirth and the puerperium: Secondary | ICD-10-CM

## 2024-10-12 DIAGNOSIS — Z79899 Other long term (current) drug therapy: Secondary | ICD-10-CM | POA: Insufficient documentation

## 2024-10-12 DIAGNOSIS — O24112 Pre-existing diabetes mellitus, type 2, in pregnancy, second trimester: Secondary | ICD-10-CM | POA: Insufficient documentation

## 2024-10-12 DIAGNOSIS — Z3A21 21 weeks gestation of pregnancy: Secondary | ICD-10-CM | POA: Insufficient documentation

## 2024-10-12 DIAGNOSIS — Z3686 Encounter for antenatal screening for cervical length: Secondary | ICD-10-CM | POA: Insufficient documentation

## 2024-10-12 DIAGNOSIS — O24012 Pre-existing diabetes mellitus, type 1, in pregnancy, second trimester: Secondary | ICD-10-CM

## 2024-10-12 DIAGNOSIS — Z363 Encounter for antenatal screening for malformations: Secondary | ICD-10-CM | POA: Insufficient documentation

## 2024-10-12 DIAGNOSIS — O24119 Pre-existing diabetes mellitus, type 2, in pregnancy, unspecified trimester: Secondary | ICD-10-CM

## 2024-10-12 DIAGNOSIS — E119 Type 2 diabetes mellitus without complications: Secondary | ICD-10-CM

## 2024-10-12 MED ORDER — PROGESTERONE 200 MG PO CAPS: 200.0000 mg | ORAL_CAPSULE | Freq: Every day | ORAL | 0 refills | Status: AC

## 2024-10-12 NOTE — Progress Notes (Unsigned)
 MFM Consult Note

## 2024-10-14 ENCOUNTER — Ambulatory Visit: Attending: Obstetrics

## 2024-10-14 DIAGNOSIS — O99013 Anemia complicating pregnancy, third trimester: Secondary | ICD-10-CM

## 2024-10-14 DIAGNOSIS — D563 Thalassemia minor: Secondary | ICD-10-CM

## 2024-10-14 DIAGNOSIS — Z3A22 22 weeks gestation of pregnancy: Secondary | ICD-10-CM | POA: Diagnosis not present

## 2024-10-14 DIAGNOSIS — O35BXX0 Maternal care for other (suspected) fetal abnormality and damage, fetal cardiac anomalies, not applicable or unspecified: Secondary | ICD-10-CM

## 2024-10-14 DIAGNOSIS — O35BXX1 Maternal care for other (suspected) fetal abnormality and damage, fetal cardiac anomalies, fetus 1: Secondary | ICD-10-CM

## 2024-10-14 NOTE — Progress Notes (Unsigned)
 [facility] for Maternal Fetal Care at [facility] [address], [address] Phone:  [phone]   Fax:  [phone]      Virtual Visit via Video Note  I connected with  [name] on 10/14/24 at  2:30 PM EDT by a video enabled telemedicine application and verified that I am speaking with the correct person using two identifiers.  Location: Patient: Home. Provider: Maternal Fetal Care.   I discussed the limitations of evaluation and management by telemedicine and the availability of in person appointments. The patient expressed understanding and agreed to proceed.  Genetic Counseling Clinic Note:   I spoke with 33 y.o. Tammy [name] today to discuss her carrier screening and fetal ultrasound results. She was referred by Verta Blossom, CNM.   Pregnancy/Family History:   A detailed pregnancy/family history was not obtained during the session due to time constraints. [id number]. EGA: [redacted]w[redacted]d by US . EDD: 02/17/2025. The patient stated she will discuss with FOB his family history and will reach out with more information.'  Patient denies significant family history for birth defects or genetic conditions.   Echogenic Intracardiac Focus on Fetal Ultrasound:  An echogenic intracardiac focus (EIF) was found during the fetal ultrasound on 10/12/2024. An EIF is a small bright spot seen on the fetal heart, possibly due to increased calcification in the cardiac muscle and is found in 3-5% of euploid pregnancies. They are usually found in the left ventricle, which is one of the lower chambers of the heart. EIFs are not diagnostic and do not represent a structural or functional cardiac abnormality. Some studies have suggested an increased risk of chromosomal abnormalities, such as trisomy 44 (Down syndrome), with an EIF finding. While it is considered a soft marker for Down syndrome, an isolated EIF is considered to be a normal variant in the context of low risk NIPS results.  Current [facility] guidelines do not recommend any further diagnostic testing for an isolated EIF in the context of low risk NIPS results. It was discussed that screening tests, including ultrasound, cannot rule out all birth defects or genetic syndromes. The patient was also referred for a [facility] fetal echocardiogram by the MFM.  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 She screened negative for the other 13 conditions which significantly reduces but does not eliminate the chance of being a carrier for those conditions. Please see report for details.  We reviewed the genetics of alpha thalassemia, autosomal recessive mode of inheritance, and clinical features of these conditions. We reviewed that Timothea will either pass down two copies of the alpha globin gene (??) OR one copy of the alpha globin gene (-?) in each pregnancy. Therefore, this pregnancy is not at increased risk for hemoglobin Bart's due to four deletions of the alpha globin genes (--/--) regardless of her reproductive partner's carrier status. The pregnancy will be at increased risk (25%) for hemoglobin H disease if her partner is an alpha thalassemia carrier in the cis configuration (--/??). We reviewed that this is more common in Southeast Asian populations and less common in those with Black ancestry. Patient reports FOB's race is Black.  Given these results, we discussed and offered carrier screening for Lamees's reproductive partner. We reviewed the benefits and limitations of carrier screening and that it can detect most but not all carriers.  She elected we ship a  saliva kit to their address. We reviewed the requirements for sample collection. FOB Ozell would be screened for alpha thalassemia. We reviewed that if both were found to be carriers, prenatal diagnosis through amniocentesis would be available. We reviewed the  technical aspects, benefits, risks, and limitations of amniocentesis including the 1 in 500 risk for miscarriage. Alternatively, testing can be completed postnatally. Of note, alpha thalassemia is not included on [place] 's Newborn Screening (NBS) program.   Newborn Screening. The [place]  Newborn Screening (NBS) program will screen all newborn babies for cystic fibrosis, spinal muscular atrophy, hemoglobinopathies, and numerous other conditions.  Previous Testing Completed:  Low risk NIPS: Anthea previously completed [facility] noninvasive prenatal screening (NIPS) in this pregnancy. The result is low risk, consistent with a female fetus. This screening significantly reduces but does not eliminate the chance that the current pregnancy has Down syndrome (trisomy 52), trisomy 69, trisomy 28, common sex chromosome conditions, and 22q11.2 microdeletion syndrome. Please see report for details. There are many genetic conditions that cannot be detected by NIPS.    Plan of Care:   Declined amniocentesis. [facility] carrier screening saliva kit to be shipped to patient's address for alpha thalassemia carrier screening for FOB.   Informed consent was obtained. All questions were answered.   *** minutes were spent on the date of the encounter in service to the patient including preparation, consultation through video chat, ***discussion of test reports and available next steps, ***pedigree construction, ***genetic risk assessment, documentation, and care coordination.    Thank you for sharing in the care of Tammy [name] with us .  Please do not hesitate to contact us  at [phone] if you have any questions.   Lauraine Bodily, MS, [facility] Certified Genetic Counselor   Genetic counseling student involved in appointment: *** (***, CHERRI).

## 2024-10-15 DIAGNOSIS — D563 Thalassemia minor: Secondary | ICD-10-CM | POA: Insufficient documentation

## 2024-10-15 DIAGNOSIS — O35BXX0 Maternal care for other (suspected) fetal abnormality and damage, fetal cardiac anomalies, not applicable or unspecified: Secondary | ICD-10-CM | POA: Insufficient documentation

## 2024-10-18 DIAGNOSIS — O26872 Cervical shortening, second trimester: Secondary | ICD-10-CM | POA: Insufficient documentation

## 2024-11-01 ENCOUNTER — Ambulatory Visit

## 2024-12-15 ENCOUNTER — Telehealth: Payer: Self-pay

## 2024-12-20 NOTE — Telephone Encounter (Signed)
 Called to confirm ultrasounds with patient and she stated that on her lov provider informed her she could be scheduled at the [facility] office. Informed patient that the [facility] office has no openings due to staffing and multiple days clinic closed. Please contact patient of any openings for the [facility] office due to refusal of the [facility] office that has openings upon the request of her to be seen in office by provider.

## 2024-12-21 NOTE — Telephone Encounter (Signed)
 Left vm for patient to confirm appointments and per provider [facility] has no availability until scheduled appt on 01/14/25.

## 2024-12-21 NOTE — Telephone Encounter (Signed)
 Called patient after she missed US  for dopplers in the setting of FGR today. Patient reports that she has been sick since being seen on 12/29 and can't leave the house. She does not have to have to drive an hour for ultrasounds. Patient is currently 31w pregnant.   I discussed the importance of weekly dopplers as well as BPPs to ensure well-being of her baby given FGR. We discussed clinic limitations and that if she is unable to come to [place] that she would be able to transfer her care back to [facility] if she desires a closer location.   Patient would like to schedule as many ultrasounds as possible at [facility] location. All other visits will be scheduled in [place]:   Please schedule patient for   [facility] UA dopplers for 1/9   [place]: Dopplers & BPP 1/16 11:15 30 mins. [place]: Dopplers & BPP 1/23 11:15 30 mins. [place]: Dopplers & BPP & Growth 1/30 8:00 60 mins. [place] : Dopplers & BPP 2/6 [place]: Dopplers & BPP 2/13 [place]: Dopplers & BPP & Growth 2/20 [place]: Dopplers & BPP 2/27  Thank you, Almarie Almarie Spring, MD Maternal Fetal Medicine Fellow Department of Obstetrics & Gynecology [facility]

## 2024-12-23 NOTE — Telephone Encounter (Signed)
 Left vm for patient to return call to confirm u/s for 12/24/2024 at 8:45am-[facility]

## 2024-12-23 NOTE — Telephone Encounter (Signed)
 Patient would like to know if she can be worked in for her ultrasound this morning. Please assist.

## 2025-01-18 ENCOUNTER — Encounter (HOSPITAL_COMMUNITY): Payer: Self-pay | Admitting: Obstetrics and Gynecology

## 2025-01-18 ENCOUNTER — Inpatient Hospital Stay (HOSPITAL_COMMUNITY)
Admission: AD | Admit: 2025-01-18 | Discharge: 2025-01-18 | Disposition: A | Discharge status: Home / Self Care | Attending: Obstetrics and Gynecology | Admitting: Obstetrics and Gynecology

## 2025-01-18 DIAGNOSIS — O2313 Infections of bladder in pregnancy, third trimester: Secondary | ICD-10-CM | POA: Insufficient documentation

## 2025-01-18 DIAGNOSIS — Z79899 Other long term (current) drug therapy: Secondary | ICD-10-CM | POA: Insufficient documentation

## 2025-01-18 DIAGNOSIS — O10919 Unspecified pre-existing hypertension complicating pregnancy, unspecified trimester: Secondary | ICD-10-CM

## 2025-01-18 DIAGNOSIS — Z3A35 35 weeks gestation of pregnancy: Secondary | ICD-10-CM | POA: Insufficient documentation

## 2025-01-18 DIAGNOSIS — O10913 Unspecified pre-existing hypertension complicating pregnancy, third trimester: Secondary | ICD-10-CM | POA: Insufficient documentation

## 2025-01-18 DIAGNOSIS — O4703 False labor before 37 completed weeks of gestation, third trimester: Secondary | ICD-10-CM | POA: Insufficient documentation

## 2025-01-18 DIAGNOSIS — N3000 Acute cystitis without hematuria: Secondary | ICD-10-CM | POA: Insufficient documentation

## 2025-01-18 LAB — URINALYSIS, ROUTINE W REFLEX MICROSCOPIC
Bilirubin Urine: NEGATIVE
Glucose, UA: NEGATIVE mg/dL
Hgb urine dipstick: NEGATIVE
Ketones, ur: 20 mg/dL — AB
Nitrite: NEGATIVE
Protein, ur: 30 mg/dL — AB
Specific Gravity, Urine: 1.02 (ref 1.005–1.030)
pH: 6 (ref 5.0–8.0)

## 2025-01-18 LAB — CBC WITH DIFFERENTIAL/PLATELET
Abs Immature Granulocytes: 0.05 10*3/uL (ref 0.00–0.07)
Basophils Absolute: 0 10*3/uL (ref 0.0–0.1)
Basophils Relative: 0 %
Eosinophils Absolute: 0.4 10*3/uL (ref 0.0–0.5)
Eosinophils Relative: 4 %
HCT: 33.8 % — ABNORMAL LOW (ref 36.0–46.0)
Hemoglobin: 10.9 g/dL — ABNORMAL LOW (ref 12.0–15.0)
Immature Granulocytes: 1 %
Lymphocytes Relative: 23 %
Lymphs Abs: 2.3 10*3/uL (ref 0.7–4.0)
MCH: 27.3 pg (ref 26.0–34.0)
MCHC: 32.2 g/dL (ref 30.0–36.0)
MCV: 84.7 fL (ref 80.0–100.0)
Monocytes Absolute: 0.6 10*3/uL (ref 0.1–1.0)
Monocytes Relative: 6 %
Neutro Abs: 6.6 10*3/uL (ref 1.7–7.7)
Neutrophils Relative %: 66 %
Platelets: 314 10*3/uL (ref 150–400)
RBC: 3.99 MIL/uL (ref 3.87–5.11)
RDW: 13.8 % (ref 11.5–15.5)
WBC: 9.9 10*3/uL (ref 4.0–10.5)
nRBC: 0 % (ref 0.0–0.2)

## 2025-01-18 LAB — PROTEIN / CREATININE RATIO, URINE
Creatinine, Urine: 316 mg/dL
Protein Creatinine Ratio: 0.1 mg/mg
Total Protein, Urine: 23 mg/dL

## 2025-01-18 LAB — COMPREHENSIVE METABOLIC PANEL WITH GFR
ALT: 12 U/L (ref 0–44)
AST: 18 U/L (ref 15–41)
Albumin: 3.3 g/dL — ABNORMAL LOW (ref 3.5–5.0)
Alkaline Phosphatase: 143 U/L — ABNORMAL HIGH (ref 38–126)
Anion gap: 11 (ref 5–15)
BUN: 9 mg/dL (ref 6–20)
CO2: 19 mmol/L — ABNORMAL LOW (ref 22–32)
Calcium: 9.3 mg/dL (ref 8.9–10.3)
Chloride: 105 mmol/L (ref 98–111)
Creatinine, Ser: 0.78 mg/dL (ref 0.44–1.00)
GFR, Estimated: >60 mL/min
Glucose, Bld: 109 mg/dL — ABNORMAL HIGH (ref 70–99)
Potassium: 4 mmol/L (ref 3.5–5.1)
Sodium: 135 mmol/L (ref 135–145)
Total Bilirubin: <0.2 mg/dL (ref 0.0–1.2)
Total Protein: 6.4 g/dL — ABNORMAL LOW (ref 6.5–8.1)

## 2025-01-18 LAB — WET PREP, GENITAL
Clue Cells Wet Prep HPF POC: NONE SEEN
Sperm: NONE SEEN
Trich, Wet Prep: NONE SEEN
WBC, Wet Prep HPF POC: >=10 — AB
Yeast Wet Prep HPF POC: NONE SEEN

## 2025-01-18 MED ORDER — NITROFURANTOIN MONOHYD MACRO 100 MG PO CAPS: 100.0000 mg | ORAL_CAPSULE | Freq: Two times a day (BID) | ORAL | 1 refills | Status: DC

## 2025-01-18 MED ORDER — NITROFURANTOIN MONOHYD MACRO 100 MG PO CAPS: 100.0000 mg | ORAL_CAPSULE | Freq: Two times a day (BID) | ORAL | 1 refills | Status: AC

## 2025-01-18 MED ORDER — LABETALOL HCL 300 MG PO TABS: 300.0000 mg | ORAL_TABLET | Freq: Two times a day (BID) | ORAL | 2 refills | Status: AC

## 2025-01-18 MED ORDER — LABETALOL HCL 5 MG/ML IV SOLN: 20.0000 mg | INTRAVENOUS | Status: DC | PRN

## 2025-01-18 MED ORDER — HYDRALAZINE HCL 20 MG/ML IJ SOLN: 5.0000 mg | INTRAMUSCULAR | Status: DC | PRN

## 2025-01-18 MED ORDER — LABETALOL HCL 5 MG/ML IV SOLN: 40.0000 mg | INTRAVENOUS | Status: DC | PRN

## 2025-01-18 MED ORDER — HYDRALAZINE HCL 20 MG/ML IJ SOLN: 10.0000 mg | INTRAMUSCULAR | Status: DC | PRN

## 2025-01-18 MED ORDER — LABETALOL HCL 300 MG PO TABS: 300.0000 mg | ORAL_TABLET | Freq: Two times a day (BID) | ORAL | 2 refills | Status: DC

## 2025-01-18 NOTE — Discharge Instructions (Signed)
 Your urine sample today shows that you have a UTI. In addition to increasing your fluid intake, I am sending an antibiotic to the pharmacy to treat the infection. Please finish the entire bottle of medication even after you start to feel better to prevent antibiotic resistance. The antibiotic is safe during pregnancy.   Your blood pressures were elevated today, so we recommend increasing your dose to labetalol  300 mg twice daily, taken about 12 hours apart.  Reasons to return to MAU at [facility]: Your blood pressure is >160/110. You have a headache that will not go away after having something to eat, something to drink, and Tylenol . You have changes in your vision or upper abdominal pain that does not get better with Tylenol . Less than 36 weeks: Contractions feels like menstrual cramps. You should go to the hospital if you have more than 6 contractions in an hour, even after you have rested and drank at least 16 ounces of water .  More than 36 weeks: You begin to have strong, frequent contractions 5 minutes apart or less, each last 1 minute, these have been going on for 1-2 hours, and you cannot walk or talk during them. Your water  breaks.  Sometimes it is a big gush of fluid. However, many times it may it may be much more subtle. You should go to the hospital if you have a constant leakage of fluid from your vagina, enough to soak a pad when you are walking around.  You have vaginal bleeding.  It is normal to have a small amount of spotting if your cervix was checked. If you have bleeding requiring the use of a pad, go to the hospital. You don't feel your baby moving like normal.  If you think that you babys movement is decreased, eat a snack and rest on your left side in a quiet room for one hour. If you have not felt the baby move more than 6 times in an hour GO TO THE HOSPITAL.

## 2025-01-18 NOTE — MAU Note (Addendum)
 MAU Triage Note: Tammy [name] is a 34 y.o. at [redacted]w[redacted]d here in MAU reporting: was told to come in for evaluation by her OB for r/o UTI. She reports irregular contractions and left-sided flank pain since 0700 yesterday morning. She also reports urinary frequency and burning with urination. She reports passing a stone yesterday afternoon - denies h/o kidney stones. Denies VB or watery LOF - does report clear discharge. Reports +FM. Cerclage in place.   PIH Assessment: Headache present: No  Visual disturbances: None RUQ pain/Epigastric: None Atypical edema: None Hx of HBP: CHTN BP Medications: Taking 200 mg Labetalol  BID; last dose at 1500  Patient complaint: possible uti ctx  Pain Score: 5  Pain Location: Abdomen Pain Score: 4 Pain Location: Flank   Onset of complaint: yesterday LMP: Patient's last menstrual period was 05/18/2024 (exact date).  Vitals:   01/18/25 0305  BP: (!) 154/92  Pulse: (!) 113  Resp: 18  Temp: 98.7 F (37.1 C)  SpO2: 97%    FHT:  Fetal Heart Rate Mode: External Baseline Rate (A): 150 bpm Lab orders placed from triage: UA

## 2025-01-19 ENCOUNTER — Inpatient Hospital Stay (HOSPITAL_COMMUNITY): Admission: AD | Admit: 2025-01-19 | Source: Home / Self Care | Admitting: Obstetrics and Gynecology

## 2025-01-19 ENCOUNTER — Encounter (HOSPITAL_COMMUNITY): Payer: Self-pay | Admitting: Obstetrics and Gynecology

## 2025-01-19 ENCOUNTER — Inpatient Hospital Stay (HOSPITAL_COMMUNITY)

## 2025-01-19 ENCOUNTER — Other Ambulatory Visit: Payer: Self-pay

## 2025-01-19 ENCOUNTER — Inpatient Hospital Stay (HOSPITAL_COMMUNITY): Admitting: Anesthesiology

## 2025-01-19 DIAGNOSIS — O99013 Anemia complicating pregnancy, third trimester: Secondary | ICD-10-CM

## 2025-01-19 DIAGNOSIS — O24113 Pre-existing diabetes mellitus, type 2, in pregnancy, third trimester: Secondary | ICD-10-CM

## 2025-01-19 DIAGNOSIS — O10013 Pre-existing essential hypertension complicating pregnancy, third trimester: Secondary | ICD-10-CM

## 2025-01-19 DIAGNOSIS — Z3A35 35 weeks gestation of pregnancy: Secondary | ICD-10-CM

## 2025-01-19 DIAGNOSIS — D563 Thalassemia minor: Secondary | ICD-10-CM

## 2025-01-19 DIAGNOSIS — Z98891 History of uterine scar from previous surgery: Secondary | ICD-10-CM

## 2025-01-19 DIAGNOSIS — O4703 False labor before 37 completed weeks of gestation, third trimester: Secondary | ICD-10-CM

## 2025-01-19 DIAGNOSIS — O09293 Supervision of pregnancy with other poor reproductive or obstetric history, third trimester: Secondary | ICD-10-CM

## 2025-01-19 DIAGNOSIS — E119 Type 2 diabetes mellitus without complications: Secondary | ICD-10-CM

## 2025-01-19 DIAGNOSIS — O42913 Preterm premature rupture of membranes, unspecified as to length of time between rupture and onset of labor, third trimester: Principal | ICD-10-CM

## 2025-01-19 DIAGNOSIS — O4290 Premature rupture of membranes, unspecified as to length of time between rupture and onset of labor, unspecified weeks of gestation: Secondary | ICD-10-CM | POA: Diagnosis present

## 2025-01-19 DIAGNOSIS — Z3689 Encounter for other specified antenatal screening: Secondary | ICD-10-CM

## 2025-01-19 DIAGNOSIS — O368390 Maternal care for abnormalities of the fetal heart rate or rhythm, unspecified trimester, not applicable or unspecified: Secondary | ICD-10-CM | POA: Diagnosis not present

## 2025-01-19 LAB — COMPREHENSIVE METABOLIC PANEL WITH GFR
ALT: 11 U/L (ref 0–44)
AST: 18 U/L (ref 15–41)
Albumin: 3.3 g/dL — ABNORMAL LOW (ref 3.5–5.0)
Alkaline Phosphatase: 138 U/L — ABNORMAL HIGH (ref 38–126)
Anion gap: 10 (ref 5–15)
BUN: 10 mg/dL (ref 6–20)
CO2: 21 mmol/L — ABNORMAL LOW (ref 22–32)
Calcium: 8.9 mg/dL (ref 8.9–10.3)
Chloride: 105 mmol/L (ref 98–111)
Creatinine, Ser: 0.7 mg/dL (ref 0.44–1.00)
GFR, Estimated: >60 mL/min
Glucose, Bld: 78 mg/dL (ref 70–99)
Potassium: 4.4 mmol/L (ref 3.5–5.1)
Sodium: 136 mmol/L (ref 135–145)
Total Bilirubin: <0.2 mg/dL (ref 0.0–1.2)
Total Protein: 6.2 g/dL — ABNORMAL LOW (ref 6.5–8.1)

## 2025-01-19 LAB — PROTEIN / CREATININE RATIO, URINE
Creatinine, Urine: 157 mg/dL
Protein Creatinine Ratio: 0.1 mg/mg
Total Protein, Urine: 11 mg/dL

## 2025-01-19 LAB — TYPE AND SCREEN
ABO/RH(D): A POS
Antibody Screen: NEGATIVE

## 2025-01-19 LAB — CBC
HCT: 34.2 % — ABNORMAL LOW (ref 36.0–46.0)
Hemoglobin: 10.9 g/dL — ABNORMAL LOW (ref 12.0–15.0)
MCH: 27 pg (ref 26.0–34.0)
MCHC: 31.9 g/dL (ref 30.0–36.0)
MCV: 84.9 fL (ref 80.0–100.0)
Platelets: 305 10*3/uL (ref 150–400)
RBC: 4.03 MIL/uL (ref 3.87–5.11)
RDW: 14.1 % (ref 11.5–15.5)
WBC: 8 10*3/uL (ref 4.0–10.5)
nRBC: 0 % (ref 0.0–0.2)

## 2025-01-19 LAB — GROUP B STREP BY PCR: Group B strep by PCR: NEGATIVE

## 2025-01-19 LAB — WET PREP, GENITAL
Clue Cells Wet Prep HPF POC: NONE SEEN
Sperm: NONE SEEN
Trich, Wet Prep: NONE SEEN
WBC, Wet Prep HPF POC: >=10 — AB
Yeast Wet Prep HPF POC: NONE SEEN

## 2025-01-19 LAB — POCT FERN TEST: POCT Fern Test: POSITIVE

## 2025-01-19 LAB — RUPTURE OF MEMBRANE (ROM)PLUS: Rom Plus: NEGATIVE

## 2025-01-19 MED ORDER — OXYCODONE-ACETAMINOPHEN 5-325 MG PO TABS: 2.0000 | ORAL_TABLET | ORAL | Status: DC | PRN

## 2025-01-19 MED ORDER — SOD CITRATE-CITRIC ACID 500-334 MG/5ML PO SOLN
30.0000 mL | ORAL | Status: DC | PRN
  Filled 2025-01-19: qty 30

## 2025-01-19 MED ORDER — LABETALOL HCL 100 MG PO TABS
100.0000 mg | ORAL_TABLET | Freq: Two times a day (BID) | ORAL | Status: AC
  Administered 2025-01-19 – 2025-01-21 (×5): 100 mg via ORAL
  Filled 2025-01-19 (×5): qty 1

## 2025-01-19 MED ORDER — SODIUM CHLORIDE 0.9 % IV SOLN
5.0000 10*6.[IU] | Freq: Once | INTRAVENOUS | Status: AC
  Administered 2025-01-19: 5 10*6.[IU] via INTRAVENOUS
  Filled 2025-01-19: qty 5

## 2025-01-19 MED ORDER — EPHEDRINE 5 MG/ML INJ
10.0000 mg | INTRAVENOUS | Status: DC | PRN
  Filled 2025-01-19: qty 5

## 2025-01-19 MED ORDER — LACTATED RINGERS IV SOLN: INTRAVENOUS | Status: DC

## 2025-01-19 MED ORDER — DIPHENHYDRAMINE HCL 50 MG/ML IJ SOLN
12.5000 mg | INTRAMUSCULAR | Status: DC | PRN
  Administered 2025-01-19: 12.5 mg via INTRAVENOUS
  Filled 2025-01-19: qty 1

## 2025-01-19 MED ORDER — ONDANSETRON HCL 4 MG/2ML IJ SOLN
4.0000 mg | Freq: Four times a day (QID) | INTRAMUSCULAR | Status: DC | PRN
  Filled 2025-01-19: qty 2

## 2025-01-19 MED ORDER — PENICILLIN G POT IN DEXTROSE 60000 UNIT/ML IV SOLN
3.0000 10*6.[IU] | INTRAVENOUS | Status: DC
  Administered 2025-01-19 – 2025-01-20 (×3): 3 10*6.[IU] via INTRAVENOUS
  Filled 2025-01-19 (×3): qty 50

## 2025-01-19 MED ORDER — OXYTOCIN-SODIUM CHLORIDE 30-0.9 UT/500ML-% IV SOLN: 2.5000 [IU]/h | INTRAVENOUS | Status: DC

## 2025-01-19 MED ORDER — LIDOCAINE HCL (PF) 1 % IJ SOLN: 30.0000 mL | INTRAMUSCULAR | Status: DC | PRN

## 2025-01-19 MED ORDER — LACTATED RINGERS IV SOLN: 500.0000 mL | INTRAVENOUS | Status: AC | PRN

## 2025-01-19 MED ORDER — LIDOCAINE HCL (PF) 1 % IJ SOLN
INTRAMUSCULAR | Status: DC | PRN
  Administered 2025-01-19: 10 mL via EPIDURAL

## 2025-01-19 MED ORDER — OXYTOCIN BOLUS FROM INFUSION: 333.0000 mL | Freq: Once | INTRAVENOUS | Status: DC

## 2025-01-19 MED ORDER — PHENYLEPHRINE 80 MCG/ML (10ML) SYRINGE FOR IV PUSH (FOR BLOOD PRESSURE SUPPORT)
80.0000 ug | PREFILLED_SYRINGE | INTRAVENOUS | Status: DC | PRN
  Administered 2025-01-20 (×2): 80 ug via INTRAVENOUS
  Filled 2025-01-19 (×2): qty 10

## 2025-01-19 MED ORDER — EPHEDRINE 5 MG/ML INJ: 10.0000 mg | INTRAVENOUS | Status: DC | PRN

## 2025-01-19 MED ORDER — FENTANYL-BUPIVACAINE-NACL 0.5-0.125-0.9 MG/250ML-% EP SOLN
12.0000 mL/h | EPIDURAL | Status: DC | PRN
  Administered 2025-01-19 – 2025-01-20 (×2): 12 mL/h via EPIDURAL
  Filled 2025-01-19 (×2): qty 250

## 2025-01-19 MED ORDER — FENTANYL CITRATE (PF) 100 MCG/2ML IJ SOLN
50.0000 ug | INTRAMUSCULAR | Status: DC | PRN
  Filled 2025-01-19: qty 2

## 2025-01-19 MED ORDER — OXYCODONE-ACETAMINOPHEN 5-325 MG PO TABS: 1.0000 | ORAL_TABLET | ORAL | Status: DC | PRN

## 2025-01-19 MED ORDER — PHENYLEPHRINE 80 MCG/ML (10ML) SYRINGE FOR IV PUSH (FOR BLOOD PRESSURE SUPPORT): 80.0000 ug | PREFILLED_SYRINGE | INTRAVENOUS | Status: DC | PRN

## 2025-01-19 MED ORDER — LACTATED RINGERS IV SOLN: 500.0000 mL | Freq: Once | INTRAVENOUS | Status: AC

## 2025-01-19 MED ORDER — ACETAMINOPHEN 325 MG PO TABS: 650.0000 mg | ORAL_TABLET | ORAL | Status: DC | PRN

## 2025-01-19 MED ORDER — LACTATED RINGERS IV BOLUS
1000.0000 mL | Freq: Once | INTRAVENOUS | Status: AC
  Administered 2025-01-19: 1000 mL via INTRAVENOUS

## 2025-01-19 MED ORDER — VALACYCLOVIR HCL 500 MG PO TABS
1000.0000 mg | ORAL_TABLET | Freq: Every day | ORAL | Status: AC
  Administered 2025-01-19 – 2025-01-20 (×2): 1000 mg via ORAL
  Filled 2025-01-19 (×3): qty 2

## 2025-01-19 NOTE — MAU Note (Signed)
 G4P0 at 35.6 weeks reporting ctx q 5 min starting at 1100 today. Has cerclage inplace. Reports currently being treated for UTI on ABX and increased BP on medication. Has not taken BP medication today.

## 2025-01-19 NOTE — MAU Note (Signed)
 Tammy [name] is a 34 y.o. at [redacted]w[redacted]d here in MAU reporting: she's having ctxs that are 5-6 minutes apart for the past hour.  Denies VB and LOF.  Reports +FM.  States was instructed to be evaluated secondary has a cerclage.  LMP: 05/18/2024 Onset of complaint: today, 1 hour ago Pain score: 8 Vitals:   01/19/25 1249  BP: (!) 152/97  Pulse: 91  Resp: 20  Temp: 98.4 F (36.9 C)  SpO2: 99%     FHT: deferred secondary wearing one piece jumpsuit.  EFM will be applied once in room  Lab orders placed from triage: UA

## 2025-01-19 NOTE — Progress Notes (Signed)
 "  Labor Progress Note  Tammy [name] is a 34 y.o. female, G4P0120, IUP at 35.6 weeks weeks, presenting for PROM with cerclage in place. H/O Several losses, 4 weeks, 15.6 weeks in 2024, 17.4 weeks in 01/2024. Cerclage placed around 15wks. Type II DM.  CHTN- on Labetalol  100mg  BID. BMI 35. HSV +- Valtrex  at 36wks. Currently not taking valtrex , denies prodromal s/sx.  [facility] appt-[facility]/S trnsf back to [place]. FGR with femur noted at 30 weeks.  Not seen at [facility] since 25wks, seen at [facility]FM. H/O MDD/GAD/ADHD on zoloft  100mg . Alpha thal carrier. PCOS.   Subjective: IN room to assess, pt comfortable after epidural, cxt noted irregular on toco, Dr Henry in room to assess, used graves speculum and ring forcept to remove cerclage, scant bleeding noted, but hemostatic after, pooling noted in the vaginal canal of clear fluid, clear evidenced noted of leakage of fluid, upon SVE bag noted to appear still intact,  it must be a high leak resent another rom plus but blood has disrupted lab test. US  ordered to check AFI and resulted  12.2   wet prep done, unremarkable,  Patient Active Problem List   Diagnosis Date Noted   PROM (premature rupture of membranes) 01/19/2025   Cervical shortening, second trimester 10/18/2024   Alpha thalassemia silent carrier 10/15/2024   Fetal cardiac echogenic focus, antepartum 10/15/2024   Type 2 diabetes mellitus affecting pregnancy, antepartum 10/11/2024   Cervical cerclage suture present in second trimester 10/11/2024   Pregnancy 08/25/2024   Cervical insufficiency during pregnancy in third trimester, antepartum 08/25/2024   Alpha trait thalassemia 08/21/2024   Incompetence of cervix 02/05/2024   Bacterial vaginosis 02/05/2024   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   Preterm premature rupture of membranes (PPROM) delivered, current hospitalization 02/03/2024   Obesity affecting pregnancy, antepartum 12/25/2023   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  12/25/2023   Premature rupture of membranes 07/17/2023   IUFD at less than 20 weeks of gestation 07/17/2023   Migraine with aura and without status migrainosus, not intractable 02/10/2023   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 Total I/O In: -  Out: 250 [Urine:250] NST: FHR baseline 130 bpm, Variability: moderate, Accelerations:present, Decelerations:  Absent= Cat 1/Reactive CTX:  irregular, every 3-5 minutes Uterus gravid, soft non tender, moderate to palpate with contractions.  SVE:  Dilation: 3 Effacement (%): 80 Exam by:: Tammy [name]  CNM Pitocin  at 3mUn/min Bedside US  done to confirm vertex.   Assessment:  Tammy [name] is a 34 y.o. female, G4P0120, IUP at 35.6 weeks weeks, presenting for PROM with cerclage in place. H/O Several losses, 4 weeks, 15.6 weeks in 2024, 17.4 weeks in 01/2024. Cerclage placed around 15wks. Type II DM.  CHTN- on Labetalol  100mg  BID. BMI 35. HSV +- Valtrex  at 36wks. Currently not taking valtrex , denies prodromal s/sx.  [facility] appt-[facility]/S trnsf back to [place]. FGR with femur noted at 30 weeks.  Not seen at [facility] since 25wks, seen at [facility]FM. H/O MDD/GAD/ADHD on  zoloft  100mg . Alpha thal carrier. PCOS.  Patient Active Problem List   Diagnosis Date Noted   PROM (premature rupture of membranes) 01/19/2025   Cervical shortening, second trimester 10/18/2024   Alpha thalassemia silent carrier 10/15/2024   Fetal cardiac echogenic focus, antepartum 10/15/2024   Type 2 diabetes mellitus affecting pregnancy, antepartum  10/11/2024   Cervical cerclage suture present in second trimester 10/11/2024   Pregnancy 08/25/2024   Cervical insufficiency during pregnancy in third trimester, antepartum 08/25/2024   Alpha trait thalassemia 08/21/2024   Incompetence of cervix 02/05/2024   Bacterial vaginosis 02/05/2024   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   Preterm premature rupture of membranes (PPROM) delivered, current hospitalization 02/03/2024   Obesity affecting pregnancy, antepartum 12/25/2023   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 12/25/2023   Premature rupture of membranes 07/17/2023   IUFD at less than 20 weeks of gestation 07/17/2023   Migraine with aura and without status migrainosus, not intractable 02/10/2023   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  CHTN: continue labetalol  100mg  BID PO, asymptomatic,    Plan: Continue labor plan Continuous monitoring Rest Frequent position changes to facilitate fetal rotation and descent. Will reassess with cervical exam at 4 hours or earlier if necessary PCN G for GBS prophylaxis with PCR pending.  Expectant management  CHTN: continue labetalol  100mg  BID GDMA1: monitor CBG Q4H Valtrex  1000mg  daily for prophylactics.  Anticipate labor progression and vaginal delivery.   Md Henry aware of plan and verbalized agreement.   Tammy [name]  CNM, FNP-C, PMHNP-BC  [address]  [place], [place] [address]  Cell: [phone]  Office Phone: [phone] Fax: [phone] 01/19/2025  9:40 PM    "

## 2025-01-19 NOTE — MAU Provider Note (Signed)
 Chief Complaint:  Contractions   HPI    Tammy [name] is a 34 y.o. G4P0120 at [redacted]w[redacted]d who presents to maternity admissions reporting ctx q 5 minutes that started at 1100 today. Patient reports she does have a cerclage in place. She denies VB, LOF, and reports good FM's  Patient reports she is currently be treated for UTI on Antibiotics and is a cHTN on meds. Patient reports she did not take her medication today  Her antihypertensive regimen  is Labetalol  300 mg BID. She denies any HA's, visual changes, RUQ pain, N/V/D, CP, or SOB  Pregnancy Course: [facility]  Pregnancy Course: Receives care at [facility]. Prenatal records reviewed, only available through 08/25/2024. Pregnancy complicated by cHTN, T2DM, cervical insufficiency with cervical cerclage in place, genital HSV.   Patient later reported to me and RN that  she is not taking her Valtrex  ppx    Past Medical History:  Diagnosis Date   ADHD    Anxiety    Depression    HSV infection    Hypertension    PCOS (polycystic ovarian syndrome)    OB History  Gravida Para Term Preterm AB Living  4 1 0 1 2 0  SAB IAB Ectopic Multiple Live Births  2 0 0 0 0    # Outcome Date GA Lbr Len/2nd Weight Sex Type Anes PTL Lv  4 Current           3 Preterm 02/07/24 [redacted]w[redacted]d  145 g F Vag-Breech None  LIV     Birth Comments: Non-viable gestation with signs of life at delivery ([redacted]w[redacted]d).  2 SAB 07/17/23 [redacted]w[redacted]d    SAB   FD  1 SAB 2023 [redacted]w[redacted]d          Past Surgical History:  Procedure Laterality Date   CERVICAL CERCLAGE Bilateral 01/23/2024   Procedure: CERCLAGE CERVICAL;  Surgeon: Henry Slough, MD;  Location: [facility] LD ORS;  Service: Gynecology;  Laterality: Bilateral;   CERVICAL CERCLAGE N/A 08/25/2024   Procedure: CERCLAGE, CERVIX, VAGINAL APPROACH;  Surgeon: Henry Slough, MD;  Location: [facility] LD ORS;  Service: Obstetrics;  Laterality: N/A;   DILATION AND EVACUATION N/A 02/07/2024   Procedure: DILATATION AND EVACUATION;  Surgeon: Henry Slough,  MD;  Location: [facility] LD ORS;  Service: Gynecology;  Laterality: N/A;   SHOULDER SURGERY     WISDOM TOOTH EXTRACTION Bilateral    Family History  Problem Relation Age of Onset   Diabetes Maternal Grandmother    Social History[1] Allergies[2] Medications Prior to Admission  Medication Sig Dispense Refill Last Dose/Taking   amphetamine -dextroamphetamine  (ADDERALL) 10 MG tablet Take 1 tablet (10 mg total) by mouth daily with breakfast. 20 tablet 0 Past Week   aspirin  EC 81 MG tablet Take 1 tablet (81 mg total) by mouth daily. Swallow whole. 30 tablet 12 01/18/2025   labetalol  (NORMODYNE ) 300 MG tablet Take 1 tablet (300 mg total) by mouth 2 (two) times daily. 60 tablet 2 01/18/2025   nitrofurantoin , macrocrystal-monohydrate, (MACROBID ) 100 MG capsule Take 1 capsule (100 mg total) by mouth 2 (two) times daily. 14 capsule 1 01/19/2025   Prenatal Vit-Fe Fumarate-FA (PRENATAL MULTIVITAMIN) TABS tablet Take 1 tablet by mouth daily at 12 noon. 90 tablet 1 01/18/2025   sertraline  (ZOLOFT ) 100 MG tablet Take 1 tablet (100 mg total) by mouth daily. 30 tablet 0 01/18/2025   acetaminophen  (TYLENOL ) 500 MG tablet Take 2 tablets (1,000 mg total) by mouth every 6 (six) hours as needed for moderate pain (pain score 4-6).  30 tablet 0    escitalopram  (LEXAPRO ) 20 MG tablet Take 20 mg by mouth daily.      hydrOXYzine (ATARAX) 25 MG tablet Take 25 mg by mouth every 8 (eight) hours as needed.      progesterone  (PROMETRIUM ) 200 MG capsule Place 1 capsule (200 mg total) vaginally daily. 90 capsule 0    senna-docusate (SENOKOT-S) 8.6-50 MG tablet Take 2 tablets by mouth at bedtime as needed for mild constipation or moderate constipation. 30 tablet 0    sodium phosphate  (FLEET) ENEM Place 133 mLs (1 enema total) rectally once as needed for up to 1 dose for severe constipation. 133 mL 0     I have reviewed patient's Past Medical Hx, Surgical Hx, Family Hx, Social Hx, medications and allergies.   ROS  Pertinent items noted in HPI  and remainder of comprehensive ROS otherwise negative.   PHYSICAL EXAM  Patient Vitals for the past 24 hrs:  BP Temp Temp src Pulse Resp SpO2 Height Weight  01/19/25 1501 124/70 -- -- 88 -- -- -- --  01/19/25 1306 128/75 -- -- 92 -- -- -- --  01/19/25 1249 (!) 152/97 98.4 F (36.9 C) Oral 91 20 99 % -- --  01/19/25 1244 -- -- -- -- -- -- 5' 8 (1.727 m) 115.7 kg    Constitutional: Well-developed, obese female in no acute distress.  Cardiovascular: normal rate & rhythm, warm and well-perfused Respiratory: normal effort, no problems with respiration noted GI: Abd soft, non-tender, gravid, mild ctx palpable MS: Extremities nontender, no edema, normal ROM Neurologic: Alert and oriented x 4.  Pelvic: Chaperoned by Lavanda Bolder, RN SVE: Closed, Cerclage in situ , no tension -> Plan to recheck in 1-2 hours   @ 1538 Speculum exam: Pooling of clear fluid visualized, Fern collected, no visual lesions , cervix visually closed with cerclage in Situ  SVE: No cervical change - High Suspicion for PROM clinically   FERN Positive      Fetal Tracing: NST Cat 2, Reactive @ 1538 Baseline: 140 Variability: moderate Accelerations: present Decelerations: occasional variables noted  Toco: ctx q 3-5 minutes and patient does feel them   Labs: Results for orders placed or performed during the hospital encounter of 01/19/25 (from the past 24 hours)  Rupture of Membrane (ROM) Plus     Status: None   Collection Time: 01/19/25  1:51 PM  Result Value Ref Range   Rom Plus NEGATIVE   Type and screen [facility]     Status: None (Preliminary result)   Collection Time: 01/19/25  2:55 PM  Result Value Ref Range   ABO/RH(D) PENDING    Antibody Screen PENDING    Sample Expiration      01/22/2025,2359 Performed at [facility], [address] [place], [place] [address]   CBC     Status: Abnormal   Collection Time: 01/19/25  2:56 PM  Result Value Ref Range   WBC 8.0 4.0 - 10.5  K/uL   RBC 4.03 3.87 - 5.11 MIL/uL   Hemoglobin 10.9 (L) 12.0 - 15.0 g/dL   HCT 65.7 (L) 63.9 - 53.9 %   MCV 84.9 80.0 - 100.0 fL   MCH 27.0 26.0 - 34.0 pg   MCHC 31.9 30.0 - 36.0 g/dL   RDW 85.8 88.4 - 84.4 %   Platelets 305 150 - 400 K/uL   nRBC 0.0 0.0 - 0.2 %  POCT fern test     Status: Normal  Collection Time: 01/19/25  3:53 PM  Result Value Ref Range   POCT Fern Test Positive = ruptured amniotic membanes     Imaging:  No results found.  MDM & MAU COURSE  MDM:  HIGH  R/O PTL/PPROM  Confirmed PROM @ [redacted]w[redacted]d with cerclage in situ D/W Dr Eveline [facility] MAU Attending) Recommendations are for cerclage removal and delivery   Contacted [facility] ETTER Alan Molt, CNM) notified DR Henry is OB Attending on Call and will be notified by A. Molt, CNM that patient is also considering a primary C- Section. NPO Status is 0800   MAU Course: Orders Placed This Encounter  Procedures   Rupture of Membrane (ROM) Plus   CBC   Protein / creatinine ratio, urine   Comprehensive metabolic panel with GFR   RPR W/RFLX TO RPR TITER, TREPONEMAL AB, SCREEN AND DIAGNOSIS   Measure blood pressure   POCT fern test   Type and screen [facility]   Saline lock IV   Meds ordered this encounter  Medications   lactated ringers  bolus 1,000 mL    I have reviewed the patient chart and performed the physical exam . I have ordered & interpreted the lab results and reviewed and interpreted the NST   ASSESSMENT   1. Preterm premature rupture of membranes in third trimester, unspecified duration to onset of labor   2. [redacted] weeks gestation of pregnancy   3. NST (non-stress test) reactive on fetal surveillance   4. Preterm uterine contractions, antepartum, third trimester     PLAN  Admission to LD for PROM at [redacted]w[redacted]d  Cerclage in Situ Admission orders to follow by [facility] provider ( Private OB) --------------------------------------------------------------------------------------- Olam Dalton, MSN, [facility], Center for [facility]       [1]  Social History Tobacco Use   Smoking status: Former    Types: Cigarettes   Smokeless tobacco: Never  Vaping Use   Vaping status: Former  Substance Use Topics   Alcohol use: Not Currently    Comment: weekly   Drug use: Not Currently  [2] No Known Allergies

## 2025-01-19 NOTE — H&P (Cosign Needed)
 "   Tammy [name] is a 34 y.o. female, G4P0120, IUP at 35.6 weeks weeks, presenting for PROM with cerclage in place. H/O Several losses, 4 weeks, 15.6 weeks in 2024, 17.4 weeks in 01/2024. Cerclage placed around 15wks. Type II DM.  CHTN- on Labetalol  100mg  BID. BMI 35. HSV +- Valtrex  at 36wks. Currently not taking valtrex , denies prodromal s/sx.  [facility] appt-W/S trnsf back to [place]. FGR with femur noted at 30 weeks.  [facility] 25wks, seen at [facility]. H/O MDD/GAD/ADHD on zoloft  100mg . Alpha thal carrier. PCOS. Pt endorse + Fm. Denies vaginal bleeding.   Patient Active Problem List   Diagnosis Date Noted   PROM (premature rupture of membranes) 01/19/2025   Cervical shortening, second trimester 10/18/2024   Alpha thalassemia silent carrier 10/15/2024   Fetal cardiac echogenic focus, antepartum 10/15/2024   Type 2 diabetes mellitus affecting pregnancy, antepartum 10/11/2024   Cervical cerclage suture present in second trimester 10/11/2024   Pregnancy 08/25/2024   Cervical insufficiency during pregnancy in third trimester, antepartum 08/25/2024   Alpha trait thalassemia 08/21/2024   Incompetence of cervix 02/05/2024   Bacterial vaginosis 02/05/2024   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   Preterm premature rupture of membranes (PPROM) delivered, current hospitalization 02/03/2024   Obesity affecting pregnancy, antepartum 12/25/2023   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 12/25/2023   Premature rupture of membranes 07/17/2023   IUFD at less than 20 weeks of gestation 07/17/2023   Migraine with aura and without status migrainosus, not intractable 02/10/2023   Genital herpes simplex 08/28/2022   Chronic hypertension affecting pregnancy 08/25/2022   Mixed anxiety and depressive disorder 08/16/2022   Polycystic ovary syndrome 08/16/2022   Anxiety 08/19/2019   Attention deficit hyperactivity disorder 08/19/2019   Eczema 10/30/2017     Active Ambulatory  Problems    Diagnosis Date Noted   Chronic hypertension affecting pregnancy 08/25/2022   Premature rupture of membranes 07/17/2023   IUFD at less than 20 weeks of gestation 07/17/2023   Obesity affecting pregnancy, antepartum 12/25/2023   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 12/25/2023   Preterm premature rupture of membranes (PPROM) delivered, current hospitalization 02/03/2024   Incompetence of cervix 02/05/2024   Bacterial vaginosis 02/05/2024   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   Pregnancy 08/25/2024   Cervical insufficiency during pregnancy in third trimester, antepartum 08/25/2024   Type 2 diabetes mellitus affecting pregnancy, antepartum 10/11/2024   Cervical cerclage suture present in second trimester 10/11/2024   Alpha thalassemia silent carrier 10/15/2024   Fetal cardiac echogenic focus, antepartum 10/15/2024   Alpha trait thalassemia 08/21/2024   Anxiety 08/19/2019   Attention deficit hyperactivity disorder 08/19/2019   Cervical shortening, second trimester 10/18/2024   Eczema 10/30/2017   Genital herpes simplex 08/28/2022   Migraine with aura and without status migrainosus, not intractable 02/10/2023   Mixed anxiety and depressive disorder 08/16/2022   Polycystic ovary syndrome 08/16/2022   Resolved Ambulatory Problems    Diagnosis Date Noted   Acute blood loss anemia (ABLA) 07/19/2023   Past Medical History:  Diagnosis Date   ADHD    Depression    HSV infection    Hypertension    PCOS (polycystic ovarian syndrome)       Medications Prior to Admission  Medication Sig Dispense Refill Last Dose/Taking   amphetamine -dextroamphetamine  (ADDERALL) 10 MG tablet Take 1 tablet (10 mg total) by mouth daily with breakfast. 20 tablet 0 Past Week   aspirin  EC 81 MG tablet Take 1  tablet (81 mg total) by mouth daily. Swallow whole. 30 tablet 12 01/18/2025   labetalol  (NORMODYNE ) 300 MG tablet Take 1 tablet (300 mg total) by mouth 2 (two) times daily.  60 tablet 2 01/18/2025   nitrofurantoin , macrocrystal-monohydrate, (MACROBID ) 100 MG capsule Take 1 capsule (100 mg total) by mouth 2 (two) times daily. 14 capsule 1 01/19/2025   Prenatal Vit-Fe Fumarate-FA (PRENATAL MULTIVITAMIN) TABS tablet Take 1 tablet by mouth daily at 12 noon. 90 tablet 1 01/18/2025   sertraline  (ZOLOFT ) 100 MG tablet Take 1 tablet (100 mg total) by mouth daily. 30 tablet 0 01/18/2025   acetaminophen  (TYLENOL ) 500 MG tablet Take 2 tablets (1,000 mg total) by mouth every 6 (six) hours as needed for moderate pain (pain score 4-6). 30 tablet 0    escitalopram  (LEXAPRO ) 20 MG tablet Take 20 mg by mouth daily.      hydrOXYzine (ATARAX) 25 MG tablet Take 25 mg by mouth every 8 (eight) hours as needed.      progesterone  (PROMETRIUM ) 200 MG capsule Place 1 capsule (200 mg total) vaginally daily. 90 capsule 0    senna-docusate (SENOKOT-S) 8.6-50 MG tablet Take 2 tablets by mouth at bedtime as needed for mild constipation or moderate constipation. 30 tablet 0    sodium phosphate  (FLEET) ENEM Place 133 mLs (1 enema total) rectally once as needed for up to 1 dose for severe constipation. 133 mL 0     Past Medical History:  Diagnosis Date   ADHD    Anxiety    Depression    HSV infection    Hypertension    PCOS (polycystic ovarian syndrome)      Medications Ordered Prior to Encounter[1]   Allergies[2]  History of present pregnancy: Pt Info/Preference:  Screening/Consents:  Labs:   EDD: Estimated Date of Delivery: 02/17/25  Establised: Patient's last menstrual period was 05/18/2024 (exact date).  Anatomy Scan: Date: 22.4 weeks Placenta Location: [facility] Genetic Screen: Panoroma:LR AFP:  First Tri: Quad: HorizonL neg  Office: [facility] First PNV: 12.1 week then gone with mfm until 25 week and re reassumed care Blood Type --/--/A POS (02/04 1455)  Language: english Last PNV: 34 weeks Rhogam    Flu Vaccine:  declined   Antibody NEG (02/04 1455)  TDaP vaccine declined   GTT: Early:  6.1 Third Trimester: 134  Feeding Plan: Bottle/br BTL: no Rubella:    Contraception: ??? VBAC: no RPR:     Circumcision: ???   HBsAg:    Pediatrician:  ???   HIV:     Prenatal Classes: no Additional US :  [facility] 12/29--vtx, anterior placenta, FHR 150, AFI 12.88, 37%ile, EFW 2+14, 4.5%ile, AC 24%ile, UA doppler WNL, seen for FGR, with FGR secondary to femur length. Cerclage in situ. Reviewed possible etiologies of FGR, offered amnio, declined. Plan: Weekly UA dopplers (2x/week if AEDF), serial growth q 3 weeks, weekly testing from 32 weeks/NST at viability. Delivery timing pending fetal surveillance (38-39 weeks if EFW 3-10%ile, with normal UAD. 37 weeks if EFW < 3%ile or elevated UAD. 33-34 weeks if persistent AEDF, 30-32 weeks if REDF. FM precautions reviewed.  1/16 4lbs 8%, cephalic, anterior GBS:  (For PCN allergy, check sensitivities)       Chlamydia: neg    MFM Referral/Consult:  GC: neg  Support Person: freind   PAP: ???  Pain Management: epidural Neonatologist Referral:  Hgb Electrophoresis:  AA  Birth Plan: DCC   Hgb NOB: 11.9    28W: 11.2   OB History  Gravida  4   Para  1   Term  0   Preterm  1   AB  2   Living  0      SAB  2   IAB  0   Ectopic  0   Multiple  0   Live Births  0          Past Medical History:  Diagnosis Date   ADHD    Anxiety    Depression    HSV infection    Hypertension    PCOS (polycystic ovarian syndrome)    Past Surgical History:  Procedure Laterality Date   CERVICAL CERCLAGE Bilateral 01/23/2024   Procedure: CERCLAGE CERVICAL;  Surgeon: Henry Slough, MD;  Location: [facility] LD [facility]S;  Service: Gynecology;  Laterality: Bilateral;   CERVICAL CERCLAGE N/A 08/25/2024   Procedure: CERCLAGE, CERVIX, VAGINAL APPROACH;  Surgeon: Henry Slough, MD;  Location: [facility] LD ORS;  Service: Obstetrics;  Laterality: N/A;   DILATION AND EVACUATION N/A 02/07/2024   Procedure: DILATATION AND EVACUATION;  Surgeon: Henry Slough, MD;  Location:  [facility] LD ORS;  Service: Gynecology;  Laterality: N/A;   SHOULDER SURGERY     WISDOM TOOTH EXTRACTION Bilateral    Family History: family history includes Diabetes in her maternal grandmother. Social History:  reports that she has quit smoking. Her smoking use included cigarettes. She has never used smokeless tobacco. She reports that she does not currently use alcohol. She reports that she does not currently use drugs.   Prenatal Transfer Tool  Maternal Diabetes: No Genetic Screening: Normal Maternal Ultrasounds/Referrals: Normal Fetal Ultrasounds or other Referrals:  None Maternal Substance Abuse:  No Significant Maternal Medications:  None Significant Maternal Lab Results: Other: gbs unknown   ROS:  Review of Systems  Constitutional: Negative.   HENT: Negative.    Eyes: Negative.   Respiratory: Negative.    Cardiovascular: Negative.   Gastrointestinal: Negative.   Genitourinary: Negative.   Musculoskeletal: Negative.   Skin: Negative.   Neurological: Negative.   Endo/Heme/Allergies: Negative.   Psychiatric/Behavioral: Negative.       Physical Exam: BP 123/68   Pulse 91   Temp 98.4 F (36.9 C) (Oral)   Resp 20   Ht 5' 8 (1.727 m)   Wt 115.7 kg   LMP 05/18/2024 (Exact Date)   SpO2 99%   BMI 38.77 kg/m   Physical Exam Vitals and nursing note reviewed.  Constitutional:      Appearance: Normal appearance.  HENT:     Head: Normocephalic and atraumatic.     Nose: Nose normal.     Mouth/Throat:     Mouth: Mucous membranes are moist.  Eyes:     Conjunctiva/sclera: Conjunctivae normal.  Cardiovascular:     Rate and Rhythm: Normal rate and regular rhythm.     Pulses: Normal pulses.     Heart sounds: Normal heart sounds.  Pulmonary:     Effort: Pulmonary effort is normal.     Breath sounds: Normal breath sounds.  Abdominal:     General: Bowel sounds are normal.  Musculoskeletal:        General: Normal range of motion.     Cervical back: Normal range of motion  and neck supple.  Skin:    General: Skin is warm.     Capillary Refill: Capillary refill takes less than 2 seconds.  Neurological:     General: No focal deficit present.     Mental Status: She is alert.  Psychiatric:  Mood and Affect: Mood normal.      NST: FHR baseline 130 bpm, Variability: moderate, Accelerations:present, Decelerations:  Absent= Cat 1/Reactive UC:   none SVE:   Dilation: 1 Effacement (%): 80 Exam by:: Jada Tashunda Vandezande  CNM, vertex verified by fetal sutures.  Leopold's: Position vertex, EFW 5lbs via leopold's.   Labs: Results for orders placed or performed during the hospital encounter of 01/19/25 (from the past 24 hours)  Rupture of Membrane (ROM) Plus     Status: None   Collection Time: 01/19/25  1:51 PM  Result Value Ref Range   Rom Plus NEGATIVE   Protein / creatinine ratio, urine     Status: None   Collection Time: 01/19/25  2:50 PM  Result Value Ref Range   Creatinine, Urine 157 mg/dL   Total Protein, Urine 11 mg/dL   Protein Creatinine Ratio 0.1 <0.2 mg/mg  Type and screen [facility]     Status: None   Collection Time: 01/19/25  2:55 PM  Result Value Ref Range   ABO/RH(D) A POS    Antibody Screen NEG    Sample Expiration      01/22/2025,2359 Performed at [facility], [address], [place], [place] [address]   CBC     Status: Abnormal   Collection Time: 01/19/25  2:56 PM  Result Value Ref Range   WBC 8.0 4.0 - 10.5 K/uL   RBC 4.03 3.87 - 5.11 MIL/uL   Hemoglobin 10.9 (L) 12.0 - 15.0 g/dL   HCT 65.7 (L) 63.9 - 53.9 %   MCV 84.9 80.0 - 100.0 fL   MCH 27.0 26.0 - 34.0 pg   MCHC 31.9 30.0 - 36.0 g/dL   RDW 85.8 88.4 - 84.4 %   Platelets 305 150 - 400 K/uL   nRBC 0.0 0.0 - 0.2 %  Comprehensive metabolic panel with GFR     Status: Abnormal   Collection Time: 01/19/25  3:09 PM  Result Value Ref Range   Sodium 136 135 - 145 mmol/L   Potassium 4.4 3.5 - 5.1 mmol/L   Chloride 105 98 - 111 mmol/L   CO2 21 (L) 22 - 32  mmol/L   Glucose, Bld 78 70 - 99 mg/dL   BUN 10 6 - 20 mg/dL   Creatinine, Ser 9.29 0.44 - 1.00 mg/dL   Calcium  8.9 8.9 - 10.3 mg/dL   Total Protein 6.2 (L) 6.5 - 8.1 g/dL   Albumin  3.3 (L) 3.5 - 5.0 g/dL   AST 18 15 - 41 U/L   ALT 11 0 - 44 U/L   Alkaline Phosphatase 138 (H) 38 - 126 U/L   Total Bilirubin <0.2 0.0 - 1.2 mg/dL   GFR, Estimated >39 >39 mL/min   Anion gap 10 5 - 15  POCT fern test     Status: Normal   Collection Time: 01/19/25  3:53 PM  Result Value Ref Range   POCT Fern Test Positive = ruptured amniotic membanes     Imaging:  No results found.  MAU Course: Orders Placed This Encounter  Procedures   Group B strep by PCR   Rupture of Membrane (ROM) Plus   CBC   Protein / creatinine ratio, urine   Comprehensive metabolic panel with GFR   RPR W/RFLX TO RPR TITER, TREPONEMAL AB, SCREEN AND DIAGNOSIS   Diet clear liquid Room service appropriate? Yes; Fluid consistency: Thin   Measure blood pressure   May use local infiltration of 1% lidocaine  plain to  produce a skin wheal prior to IV insertion   Notify in-house Anesthesia team of nausea and vomiting greater than 5 hours   Assess for signs/symptoms of PIH/preeclampsia   RN to place order for: CBC if one has not been drawn in the past 6 hours for all patients with hypertensive disease, pre-eclampsia, eclampsia, thrombocytopenia or previous PLTC<150,000.   Identify to Anesthesia if patient plans to have postpartum tubal ligation; do not remove epidural without discussion with Anesthesiologist   Vital signs following Epidural Placement, re-bolus or re-dose monitor patient's BP and oxygen saturation every 5 minutes for 30 minutes   Pain Assessment Document numeric pain score   RN to remain at bedside continuously for 30 minutes post epidural placement, post re-bolus / re-dose   Do not administer IV opioids while epidural is in place.   Do not adjust epidural rate or discontinue epidural without notifying the  anesthesiologist.   Notify Anesthesia if the patient becomes short of breath or complains of heaviness in chest, chest pain, and/or unrelieved pain   Notify Anesthesia prior to discontinuing epidural infusion   Vitals signs per unit policy   Notify physician (specify)   Fetal monitoring per unit policy   Activity as tolerated   Cervical Exam   Measure blood pressure post delivery every 15 min x 1 hour then every 30 min x 1 hour   Fundal check post delivery every 15 min x 1 hour then every 30 min x 1 hour   Apply Labor & Delivery Care Plan   If Rapid HIV test positive or known HIV positive: initiate AZT orders   May in and out cath x 2 for inability to void   Insert urethral catheter X 1 PRN If Coude Catheter is chosen, qualified resources by campus can be found in the clinical skills nursing procedure for Coude Catheter 1. If straight catheterized > 2 times or patient unable to void post epidural plac...   Refer to Sidebar Report Urinary (Foley) Catheter Indications   Refer to Sidebar Report Post Indwelling Urinary Catheter Removal and Intervention Guidelines   Discontinue foley prior to vaginal delivery   Initiate Oral Care Protocol   Initiate Carrier Fluid Protocol   Informed Consent Details: Physician/Practitioner Attestation; Transcribe to consent form and obtain patient signature   Patient may have epidural placement upon request   Full code   POCT fern test   Type and screen [facility]   Saline lock IV   Insert and maintain IV Line   Admit to Inpatient (patient's expected length of stay will be greater than 2 midnights or inpatient only procedure)   Meds ordered this encounter  Medications   lactated ringers  bolus 1,000 mL   ePHEDrine  injection 10 mg   PHENYLephrine  80 mcg/ml in normal saline Adult IV Push Syringe (For Blood Pressure Support)   lactated ringers  infusion 500 mL   fentaNYL  2 mcg/mL w/ bupivacaine  0.125% in NS 250 mL epidural infusion    diphenhydrAMINE  (BENADRYL ) injection 12.5 mg   ePHEDrine  injection 10 mg   PHENYLephrine  80 mcg/ml in normal saline Adult IV Push Syringe (For Blood Pressure Support)   lactated ringers  infusion   FOLLOWED BY Linked Order Group    oxytocin  (PITOCIN ) IV BOLUS FROM BAG    oxytocin  (PITOCIN ) IV infusion 30 units in NS 500 mL - Premix   lactated ringers  infusion 500-1,000 mL   acetaminophen  (TYLENOL ) tablet 650 mg   oxyCODONE -acetaminophen  (PERCOCET/ROXICET) 5-325 MG per tablet 1 tablet  oxyCODONE -acetaminophen  (PERCOCET/ROXICET) 5-325 MG per tablet 2 tablet   ondansetron  (ZOFRAN ) injection 4 mg   sodium citrate -citric acid  (ORACIT) solution 30 mL   lidocaine  (PF) (XYLOCAINE ) 1 % injection 30 mL   fentaNYL  (SUBLIMAZE ) injection 50-100 mcg   FOLLOWED BY Linked Order Group    penicillin  G potassium 5 Million Units in sodium chloride  0.9 % 250 mL IVPB     Antibiotic Indication::   Group B Strep Prophylaxis    penicillin  G potassium 3 Million Units in dextrose  50mL IVPB     Antibiotic Indication::   Group B Strep Prophylaxis   valACYclovir  (VALTREX ) tablet 1,000 mg    Assessment/Plan: Maryama Racicot is a 34 y.o. female, G4P0120, IUP at 35.6 weeks weeks, presenting for PROM with cerclage in place. H/O Several losses, 4 weeks, 15.6 weeks in 2024, 17.4 weeks in 01/2024. Cerclage placed around 15wks. Type II DM.  CHTN- on Labetalol  100mg  BID. BMI 35. HSV +- Valtrex  at 36wks. Currently not taking valtrex , denies prodromal s/sx.  [facility] appt-W/S trnsf back to [place]. FGR with femur noted at 30 weeks.  Not seen at [facility] since 25wks, seen at MFM. H/O MDD/GAD/ADHD on zoloft  100mg . Alpha thal carrier. PCOS. Pt endorse + Fm. Denies vaginal bleeding.   FWB: Cat 1 Fetal Tracing.   Plan: Admit to Birthing Suite per consult with Dr Henry Routine [facility] orders Pain med/epidural prn PCN G for GBS prophylaxis with PCR pending.  Dr Henry en route to clip cerclage.  Expectant management  CHTN: continue  labetalol  100mg  BID GDMA1: monitor CBG Q4H Valtrex  1000mg  daily for prophylactics.  Anticipate labor progression   Quantina Dershem  CNM, FNP-C, PMHNP-BC  [address]  [place], [place] [address]  Cell: [phone]  Office Phone: [phone] Fax: [phone] 01/19/2025  7:07 PM          [1]  No current facility-administered medications on file prior to encounter.   Current Outpatient Medications on File Prior to Encounter  Medication Sig Dispense Refill   amphetamine -dextroamphetamine  (ADDERALL) 10 MG tablet Take 1 tablet (10 mg total) by mouth daily with breakfast. 20 tablet 0   aspirin  EC 81 MG tablet Take 1 tablet (81 mg total) by mouth daily. Swallow whole. 30 tablet 12   labetalol  (NORMODYNE ) 300 MG tablet Take 1 tablet (300 mg total) by mouth 2 (two) times daily. 60 tablet 2   nitrofurantoin , macrocrystal-monohydrate, (MACROBID ) 100 MG capsule Take 1 capsule (100 mg total) by mouth 2 (two) times daily. 14 capsule 1   Prenatal Vit-Fe Fumarate-FA (PRENATAL MULTIVITAMIN) TABS tablet Take 1 tablet by mouth daily at 12 noon. 90 tablet 1   sertraline  (ZOLOFT ) 100 MG tablet Take 1 tablet (100 mg total) by mouth daily. 30 tablet 0   acetaminophen  (TYLENOL ) 500 MG tablet Take 2 tablets (1,000 mg total) by mouth every 6 (six) hours as needed for moderate pain (pain score 4-6). 30 tablet 0   escitalopram  (LEXAPRO ) 20 MG tablet Take 20 mg by mouth daily.     hydrOXYzine (ATARAX) 25 MG tablet Take 25 mg by mouth every 8 (eight) hours as needed.     progesterone  (PROMETRIUM ) 200 MG capsule Place 1 capsule (200 mg total) vaginally daily. 90 capsule 0   senna-docusate (SENOKOT-S) 8.6-50 MG tablet Take 2 tablets by mouth at bedtime as needed for mild constipation or moderate constipation. 30 tablet 0   sodium phosphate  (FLEET) ENEM Place 133 mLs (1 enema total) rectally once as needed for up to 1 dose for  severe constipation. 133 mL 0  [2] No Known Allergies  "

## 2025-01-19 NOTE — Progress Notes (Signed)
 Positive pooling noted on spec exam by provider. Perineum and pad are visibly wet.

## 2025-01-19 NOTE — Anesthesia Procedure Notes (Signed)
 Epidural Patient location during procedure: OB Start time: 01/19/2025 5:05 PM End time: 01/19/2025 5:15 PM  Staffing Anesthesiologist: Niels Marien CROME, MD Performed: anesthesiologist   Preanesthetic Checklist Completed: patient identified, IV checked, risks and benefits discussed, monitors and equipment checked, pre-op evaluation and timeout performed  Epidural Patient position: sitting Prep: DuraPrep and site prepped and draped Patient monitoring: continuous pulse ox, blood pressure, heart rate and cardiac monitor Approach: midline Location: L3-L4 Injection technique: LOR air  Needle:  Needle type: Tuohy  Needle gauge: 17 G Needle length: 9 cm Needle insertion depth: 6 cm Catheter type: closed end flexible Catheter size: 19 Gauge Catheter at skin depth: 11 cm Test dose: negative  Assessment Sensory level: T8 Events: blood not aspirated, no cerebrospinal fluid, injection not painful, no injection resistance, no paresthesia and negative IV test  Additional Notes Patient identified. Risks/Benefits/Options discussed with patient including but not limited to bleeding, infection, nerve damage, paralysis, failed block, incomplete pain control, headache, blood pressure changes, nausea, vomiting, reactions to medication both or allergic, itching and postpartum back pain. Confirmed with bedside nurse the patient's most recent platelet count. Confirmed with patient that they are not currently taking any anticoagulation, have any bleeding history or any family history of bleeding disorders. Patient expressed understanding and wished to proceed. All questions were answered. Sterile technique was used throughout the entire procedure. Please see nursing notes for vital signs. Test dose was given through epidural catheter and negative prior to continuing to dose epidural or start infusion. Warning signs of high block given to the patient including shortness of breath, tingling/numbness in hands,  complete motor block, or any concerning symptoms with instructions to call for help. Patient was given instructions on fall risk and not to get out of bed. All questions and concerns addressed with instructions to call with any issues or inadequate analgesia.  Reason for block:procedure for pain

## 2025-01-19 NOTE — Anesthesia Preprocedure Evaluation (Signed)
"                                    Anesthesia Evaluation  Patient identified by MRN, date of birth, ID band Patient awake    Reviewed: Allergy & Precautions, NPO status , Patient's Chart, lab work & pertinent test results, reviewed documented beta blocker date and time   Airway Mallampati: II  TM Distance: >3 FB Neck ROM: Full    Dental no notable dental hx.    Pulmonary neg pulmonary ROS, former smoker   Pulmonary exam normal breath sounds clear to auscultation       Cardiovascular hypertension (cHTN), Pt. on medications and Pt. on home beta blockers Normal cardiovascular exam Rhythm:Regular Rate:Normal     Neuro/Psych  Headaches PSYCHIATRIC DISORDERS Anxiety Depression       GI/Hepatic negative GI ROS, Neg liver ROS,,,  Endo/Other  negative endocrine ROS  PCOS  Renal/GU negative Renal ROS  negative genitourinary   Musculoskeletal negative musculoskeletal ROS (+)    Abdominal   Peds  Hematology negative hematology ROS (+)   Anesthesia Other Findings presenting for PROM with cerclage in place  Reproductive/Obstetrics (+) Pregnancy                              Anesthesia Physical Anesthesia Plan  ASA: 3  Anesthesia Plan: Epidural   Post-op Pain Management:    Induction:   PONV Risk Score and Plan: Treatment may vary due to age or medical condition  Airway Management Planned: Natural Airway  Additional Equipment:   Intra-op Plan:   Post-operative Plan:   Informed Consent: I have reviewed the patients History and Physical, chart, labs and discussed the procedure including the risks, benefits and alternatives for the proposed anesthesia with the patient or authorized representative who has indicated his/her understanding and acceptance.       Plan Discussed with: Anesthesiologist  Anesthesia Plan Comments: (Patient identified. Risks, benefits, options discussed with patient including but not limited to  bleeding, infection, nerve damage, paralysis, failed block, incomplete pain control, headache, blood pressure changes, nausea, vomiting, reactions to medication, itching, and post partum back pain. Confirmed with bedside nurse the patient's most recent platelet count. Confirmed with the patient that they are not taking any anticoagulation, have any bleeding history or any family history of bleeding disorders. Patient expressed understanding and wishes to proceed. All questions were answered. )        Anesthesia Quick Evaluation  "

## 2025-01-20 ENCOUNTER — Encounter (HOSPITAL_COMMUNITY): Payer: Self-pay | Admitting: Obstetrics and Gynecology

## 2025-01-20 ENCOUNTER — Encounter (HOSPITAL_COMMUNITY): Admission: AD | Payer: Self-pay | Source: Home / Self Care

## 2025-01-20 DIAGNOSIS — O368390 Maternal care for abnormalities of the fetal heart rate or rhythm, unspecified trimester, not applicable or unspecified: Secondary | ICD-10-CM | POA: Diagnosis not present

## 2025-01-20 DIAGNOSIS — Z98891 History of uterine scar from previous surgery: Secondary | ICD-10-CM

## 2025-01-20 LAB — GLUCOSE, CAPILLARY
Glucose-Capillary: 96 mg/dL (ref 70–99)
Glucose-Capillary: 73 mg/dL (ref 70–99)
Glucose-Capillary: 94 mg/dL (ref 70–99)

## 2025-01-20 LAB — SYPHILIS: RPR W/REFLEX TO RPR TITER AND TREPONEMAL ANTIBODIES, TRADITIONAL SCREENING AND DIAGNOSIS ALGORITHM: RPR Ser Ql: NONREACTIVE

## 2025-01-20 MED ORDER — OXYCODONE HCL 5 MG/5ML PO SOLN: 5.0000 mg | Freq: Once | ORAL | Status: DC | PRN

## 2025-01-20 MED ORDER — KETOROLAC TROMETHAMINE 30 MG/ML IJ SOLN: 30.0000 mg | Freq: Four times a day (QID) | INTRAMUSCULAR | Status: DC | PRN

## 2025-01-20 MED ORDER — MEPERIDINE HCL 25 MG/ML IJ SOLN: 6.2500 mg | INTRAMUSCULAR | Status: DC | PRN

## 2025-01-20 MED ORDER — DIBUCAINE (PERIANAL) 1 % EX OINT: 1.0000 | TOPICAL_OINTMENT | CUTANEOUS | Status: AC | PRN

## 2025-01-20 MED ORDER — CEFAZOLIN SODIUM-DEXTROSE 2-4 GM/100ML-% IV SOLN: 2.0000 g | INTRAVENOUS | Status: AC

## 2025-01-20 MED ORDER — ZOLPIDEM TARTRATE 5 MG PO TABS: 5.0000 mg | ORAL_TABLET | Freq: Every evening | ORAL | Status: AC | PRN

## 2025-01-20 MED ORDER — WITCH HAZEL-GLYCERIN EX PADS: 1.0000 | MEDICATED_PAD | CUTANEOUS | Status: AC | PRN

## 2025-01-20 MED ORDER — DIPHENHYDRAMINE HCL 25 MG PO CAPS: 25.0000 mg | ORAL_CAPSULE | Freq: Four times a day (QID) | ORAL | Status: AC | PRN

## 2025-01-20 MED ORDER — OXYCODONE HCL 5 MG PO TABS
5.0000 mg | ORAL_TABLET | ORAL | Status: DC | PRN
  Administered 2025-01-21: 10 mg via ORAL
  Administered 2025-01-21: 5 mg via ORAL
  Filled 2025-01-20: qty 2
  Filled 2025-01-20: qty 1

## 2025-01-20 MED ORDER — ACETAMINOPHEN 10 MG/ML IV SOLN
INTRAVENOUS | Status: DC | PRN
  Administered 2025-01-20: 1000 mg via INTRAVENOUS

## 2025-01-20 MED ORDER — MORPHINE SULFATE (PF) 0.5 MG/ML IJ SOLN
INTRAMUSCULAR | Status: DC | PRN
  Administered 2025-01-20 (×2): 1 mg via INTRAVENOUS

## 2025-01-20 MED ORDER — OXYTOCIN-SODIUM CHLORIDE 30-0.9 UT/500ML-% IV SOLN: 2.5000 [IU]/h | INTRAVENOUS | Status: AC

## 2025-01-20 MED ORDER — PHENYLEPHRINE HCL-NACL 20-0.9 MG/250ML-% IV SOLN: INTRAVENOUS | Status: DC | PRN

## 2025-01-20 MED ORDER — SODIUM CHLORIDE 0.9 % IV SOLN
500.0000 mg | Freq: Once | INTRAVENOUS | Status: AC
  Administered 2025-01-20: 500 mg via INTRAVENOUS

## 2025-01-20 MED ORDER — SENNOSIDES-DOCUSATE SODIUM 8.6-50 MG PO TABS
2.0000 | ORAL_TABLET | Freq: Every day | ORAL | Status: AC
  Administered 2025-01-21: 2 via ORAL
  Filled 2025-01-20: qty 2

## 2025-01-20 MED ORDER — BUPIVACAINE HCL (PF) 0.25 % IJ SOLN
INTRAMUSCULAR | Status: DC | PRN
  Administered 2025-01-20 (×4): 5 mL via EPIDURAL

## 2025-01-20 MED ORDER — LACTATED RINGERS IV SOLN: INTRAVENOUS | Status: DC | PRN

## 2025-01-20 MED ORDER — DROPERIDOL 2.5 MG/ML IJ SOLN: 0.6250 mg | Freq: Once | INTRAMUSCULAR | Status: DC | PRN

## 2025-01-20 MED ORDER — TERBUTALINE SULFATE 1 MG/ML IJ SOLN
0.2500 mg | Freq: Once | INTRAMUSCULAR | Status: AC | PRN
  Administered 2025-01-20: 0.25 mg via SUBCUTANEOUS
  Filled 2025-01-20: qty 1

## 2025-01-20 MED ORDER — AZITHROMYCIN 200 MG/5ML PO SUSR: 1000.0000 mg | Freq: Once | ORAL | Status: DC

## 2025-01-20 MED ORDER — SCOPOLAMINE 1 MG/3DAYS TD PT72: 1.0000 | MEDICATED_PATCH | Freq: Once | TRANSDERMAL | Status: AC

## 2025-01-20 MED ORDER — COCONUT OIL OIL: 1.0000 | TOPICAL_OIL | Status: AC | PRN

## 2025-01-20 MED ORDER — IBUPROFEN 600 MG PO TABS
600.0000 mg | ORAL_TABLET | Freq: Four times a day (QID) | ORAL | Status: AC
  Administered 2025-01-21 (×2): 600 mg via ORAL
  Filled 2025-01-20 (×3): qty 1

## 2025-01-20 MED ORDER — MORPHINE SULFATE (PF) 0.5 MG/ML IJ SOLN
INTRAMUSCULAR | Status: AC
  Filled 2025-01-20: qty 10

## 2025-01-20 MED ORDER — DEXAMETHASONE SODIUM PHOSPHATE 4 MG/ML IJ SOLN
INTRAMUSCULAR | Status: DC | PRN
  Administered 2025-01-20: 8 mg via INTRAVENOUS

## 2025-01-20 MED ORDER — NALOXONE HCL 0.4 MG/ML IJ SOLN: 0.4000 mg | INTRAMUSCULAR | Status: AC | PRN

## 2025-01-20 MED ORDER — SOD CITRATE-CITRIC ACID 500-334 MG/5ML PO SOLN: 30.0000 mL | ORAL | Status: AC

## 2025-01-20 MED ORDER — OXYTOCIN-SODIUM CHLORIDE 30-0.9 UT/500ML-% IV SOLN
1.0000 m[IU]/min | INTRAVENOUS | Status: DC
  Administered 2025-01-20: 1 m[IU]/min via INTRAVENOUS

## 2025-01-20 MED ORDER — ACETAMINOPHEN 500 MG PO TABS
1000.0000 mg | ORAL_TABLET | Freq: Four times a day (QID) | ORAL | Status: AC
  Administered 2025-01-20 – 2025-01-21 (×4): 1000 mg via ORAL
  Filled 2025-01-20 (×5): qty 2

## 2025-01-20 MED ORDER — METOCLOPRAMIDE HCL 5 MG/ML IJ SOLN
INTRAMUSCULAR | Status: DC | PRN
  Administered 2025-01-20: 10 mg via INTRAVENOUS

## 2025-01-20 MED ORDER — ALBUMIN HUMAN 5 % IV SOLN
INTRAVENOUS | Status: AC
  Filled 2025-01-20: qty 250

## 2025-01-20 MED ORDER — FENTANYL CITRATE (PF) 100 MCG/2ML IJ SOLN: 25.0000 ug | INTRAMUSCULAR | Status: DC | PRN

## 2025-01-20 MED ORDER — DIPHENHYDRAMINE HCL 25 MG PO CAPS: 25.0000 mg | ORAL_CAPSULE | ORAL | Status: AC | PRN

## 2025-01-20 MED ORDER — MORPHINE SULFATE (PF) 0.5 MG/ML IJ SOLN
INTRAMUSCULAR | Status: DC | PRN
  Administered 2025-01-20: 3 mg via EPIDURAL

## 2025-01-20 MED ORDER — SERTRALINE HCL 100 MG PO TABS: 100.0000 mg | ORAL_TABLET | Freq: Every day | ORAL | Status: DC

## 2025-01-20 MED ORDER — MENTHOL 3 MG MT LOZG: 1.0000 | LOZENGE | OROMUCOSAL | Status: AC | PRN

## 2025-01-20 MED ORDER — STERILE WATER FOR IRRIGATION IR SOLN
Status: DC | PRN
  Administered 2025-01-20: 1

## 2025-01-20 MED ORDER — OXYCODONE HCL 5 MG PO TABS: 5.0000 mg | ORAL_TABLET | Freq: Once | ORAL | Status: DC | PRN

## 2025-01-20 MED ORDER — FENTANYL CITRATE (PF) 100 MCG/2ML IJ SOLN
INTRAMUSCULAR | Status: DC | PRN
  Administered 2025-01-20: 50 ug via INTRAVENOUS

## 2025-01-20 MED ORDER — FENTANYL CITRATE (PF) 100 MCG/2ML IJ SOLN: 50.0000 ug | INTRAMUSCULAR | Status: AC | PRN

## 2025-01-20 MED ORDER — ACETAMINOPHEN 10 MG/ML IV SOLN: 1000.0000 mg | Freq: Once | INTRAVENOUS | Status: DC | PRN

## 2025-01-20 MED ORDER — LABETALOL HCL 100 MG PO TABS: 100.0000 mg | ORAL_TABLET | Freq: Two times a day (BID) | ORAL | Status: DC

## 2025-01-20 MED ORDER — NALOXONE HCL 4 MG/10ML IJ SOLN: 1.0000 ug/kg/h | INTRAVENOUS | Status: AC | PRN

## 2025-01-20 MED ORDER — CEFAZOLIN SODIUM-DEXTROSE 2-3 GM-%(50ML) IV SOLR
INTRAVENOUS | Status: DC | PRN
  Administered 2025-01-20: 2 g via INTRAVENOUS

## 2025-01-20 MED ORDER — KETOROLAC TROMETHAMINE 30 MG/ML IJ SOLN
30.0000 mg | Freq: Four times a day (QID) | INTRAMUSCULAR | Status: AC
  Administered 2025-01-20 – 2025-01-21 (×3): 30 mg via INTRAVENOUS
  Filled 2025-01-20 (×4): qty 1

## 2025-01-20 MED ORDER — FENTANYL CITRATE (PF) 100 MCG/2ML IJ SOLN
INTRAMUSCULAR | Status: AC
  Filled 2025-01-20: qty 2

## 2025-01-20 MED ORDER — SIMETHICONE 80 MG PO CHEW: 80.0000 mg | CHEWABLE_TABLET | ORAL | Status: AC | PRN

## 2025-01-20 MED ORDER — SIMETHICONE 80 MG PO CHEW
80.0000 mg | CHEWABLE_TABLET | Freq: Three times a day (TID) | ORAL | Status: AC
  Administered 2025-01-21 (×2): 80 mg via ORAL
  Filled 2025-01-20 (×3): qty 1

## 2025-01-20 MED ORDER — PRENATAL MULTIVITAMIN CH
1.0000 | ORAL_TABLET | Freq: Every day | ORAL | Status: AC
  Administered 2025-01-21: 1 via ORAL
  Filled 2025-01-20: qty 1

## 2025-01-20 MED ORDER — DEXMEDETOMIDINE HCL IN NACL 80 MCG/20ML IV SOLN
INTRAVENOUS | Status: DC | PRN
  Administered 2025-01-20 (×4): 10 ug via INTRAVENOUS

## 2025-01-20 MED ORDER — ONDANSETRON HCL 4 MG/2ML IJ SOLN
INTRAMUSCULAR | Status: DC | PRN
  Administered 2025-01-20: 4 mg via INTRAVENOUS

## 2025-01-20 MED ORDER — SODIUM CHLORIDE 0.9% FLUSH: 3.0000 mL | INTRAVENOUS | Status: AC | PRN

## 2025-01-20 MED ORDER — ALBUMIN HUMAN 5 % IV SOLN
12.5000 g | Freq: Once | INTRAVENOUS | Status: AC
  Administered 2025-01-20: 12.5 g via INTRAVENOUS

## 2025-01-20 MED ORDER — OXYTOCIN-SODIUM CHLORIDE 30-0.9 UT/500ML-% IV SOLN
INTRAVENOUS | Status: DC | PRN
  Administered 2025-01-20: 999 mL/h via INTRAVENOUS

## 2025-01-20 MED ORDER — SERTRALINE HCL 100 MG PO TABS
100.0000 mg | ORAL_TABLET | Freq: Every day | ORAL | Status: AC
  Administered 2025-01-20 – 2025-01-21 (×2): 100 mg via ORAL
  Filled 2025-01-20 (×2): qty 1

## 2025-01-20 MED ORDER — LIDOCAINE-EPINEPHRINE (PF) 2 %-1:200000 IJ SOLN
INTRAMUSCULAR | Status: DC | PRN
  Administered 2025-01-20 (×3): 5 mL via EPIDURAL

## 2025-01-20 MED ORDER — ONDANSETRON HCL 4 MG/2ML IJ SOLN: 4.0000 mg | Freq: Three times a day (TID) | INTRAMUSCULAR | Status: AC | PRN

## 2025-01-20 MED ORDER — FENTANYL CITRATE (PF) 100 MCG/2ML IJ SOLN
INTRAMUSCULAR | Status: DC | PRN
  Administered 2025-01-20 (×3): 50 ug via EPIDURAL

## 2025-01-20 MED ORDER — SERTRALINE HCL 100 MG PO TABS
100.0000 mg | ORAL_TABLET | Freq: Every day | ORAL | Status: DC
  Filled 2025-01-20: qty 1

## 2025-01-20 MED ORDER — TRANEXAMIC ACID-NACL 1000-0.7 MG/100ML-% IV SOLN
INTRAVENOUS | Status: DC | PRN
  Administered 2025-01-20: 1000 mg via INTRAVENOUS

## 2025-01-20 MED ORDER — DIPHENHYDRAMINE HCL 50 MG/ML IJ SOLN: 12.5000 mg | INTRAMUSCULAR | Status: AC | PRN

## 2025-01-20 MED ORDER — SODIUM CHLORIDE 0.9 % IR SOLN
Status: DC | PRN
  Administered 2025-01-20: 1

## 2025-01-20 NOTE — Lactation Note (Signed)
 This note was copied from a baby's chart. Lactation Consultation Note  Patient Name: Tammy [name] Date: 01/20/2025 Age:32 hours Reason for consult: Initial assessment;1st time breastfeeding;Late-preterm 34-36.6wks;Infant < 6lbs;Maternal endocrine disorderSee MOB: MR- Bariatric surgery, hx PCOS, short interval pregnancy, Pre-diabetic, CHTN and C/S delivery. Infant currently in [facility] X2 due low body temperatures ( hypothermia) and low blood glucose of less than 20 mg/ dl. MOB used the DEBP and expressed 3 mls that was taken to [employer] and spoon fed to infant as well as infant will be received 16 mls of 22 kcal formula.  LC discussed the importance of maternal rest, meals and hydration. MOB knows that her EBM is safe for 4 hours whereas RTF formula once open is only safe for 1 hour. MOB  made aware of O/P services, breastfeeding support groups, community resources, and our phone # for post-discharge questions.    MOB will follow the LBW/LPTI feeding guidelines- Day.  1- MOB feed infant every 3 hours and limit breastfeeding to 10 minutes or less if infant latches at the breast to reserve infant's energy, immediately afterwards infant will be supplemented with any EBM first and then formula on Day 1 of life (12-13 mls ) per feeding or more if infant want it, infant will be pace feed. 2- MOB will limit total feedings to 30 minutes or less. 3- MOB will continue to pump both breast every 3 hours for 15 minutes on initial setting. 4- MOB knows to call [facility] services if she has any breastfeeding questions, concerns or need latch assistance.   Maternal Data Has patient been taught Hand Expression?: Yes Does the patient have breastfeeding experience prior to this delivery?: No  Feeding Mother's Current Feeding Choice: Breast Milk and Formula Nipple Type: Extra Slow Flow  LATCH Score                    Lactation Tools Discussed/Used Tools: Flanges;Pump Breast pump  type: Double-Electric Breast Pump Pump Education: Setup, frequency, and cleaning;Milk Storage Reason for Pumping: Infant with low blood glucose, 5 lbs 8 ounces, DM2, C/S delivery, hx PCOS and bariatric surgery. Pumping frequency: MOB will continue to pump every 3 hours for 15 minutes on inital setting. Pumped volume: 3 mL  Interventions Interventions: Skin to skin;Expressed milk;DEBP;Education;Pace feeding;Guidelines for Milk Supply and Pumping Schedule Handout;LC Services brochure;LPT handout/interventions;CDC milk storage guidelines;CDC Guidelines for Breast Pump Cleaning  Discharge    Consult Status Consult Status: Follow-up Date: 01/21/25 Follow-up type: In-patient    Grayce LULLA Batter 01/20/2025, 9:11 PM

## 2025-01-20 NOTE — Transfer of Care (Signed)
 Immediate Anesthesia Transfer of Care Note  Patient: Tammy [name]  Procedure(s) Performed: CESAREAN DELIVERY  Patient Location: PACU  Anesthesia Type:Epidural  Level of Consciousness: awake, alert , and oriented  Airway & Oxygen Therapy: Patient Spontanous Breathing  Post-op Assessment: Report given to RN and Post -op Vital signs reviewed and stable  Post vital signs: Reviewed and stable  Last Vitals:  Vitals Value Taken Time  BP 93/50 01/20/25 14:40  Temp    Pulse 92 01/20/25 14:43  Resp 17 01/20/25 14:43  SpO2 96 % 01/20/25 14:43  Vitals shown include unfiled device data.  Last Pain:  Vitals:   01/20/25 1150  TempSrc:   PainSc: 4          Complications: No notable events documented.

## 2025-01-20 NOTE — Op Note (Signed)
 Cesarean Section Operative Report  Tammy [name]  01/19/2025 - 01/20/2025  Indications: fetal indications, maternal request  Pre-operative Diagnosis: primary low transverse cesarean section  Post-operative Diagnosis: Same   Surgeon: Surgeons and Role:    * Grantland Want, Devaughn Sayres, MD - Primary     * Barkley Angles, MD - assisting  Attending Attestation: I was present and scrubbed for the entire procedure.   An experienced assistant was required given the standard of surgical care given the complexity of the case.  This assistant was needed for exposure, dissection, suctioning, retraction, instrument exchange, assisting with delivery with administration of fundal pressure, and for overall help during the procedure.  Anesthesia: epidural    Quantified Blood Loss: 794 ml  Total IV Fluids: 600 ml LR  Urine Output:: 600 ml pink-tinged urine  Specimens: none  Findings: Viable female infant in cephalic presentation; Apgars pending; weight pending; arterial cord pH 7.11;  clear amniotic fluid; intact placenta with three vessel cord; normal uterus, fallopian tubes and ovaries bilaterally. Bilateral lateral extensions.  Baby condition / location:  Couplet care / Skin to Skin   Complications: bilateral lateral extensions  Indications: Tammy [name] is a 34 y.o. [id number] with an IUP [redacted]w[redacted]d presenting for IOL for pprom with pregnancy complicated by chtn and a1gdm. Made it to 9 cm but then with recurrent deep variable decelerations that did not respond to resuscitative measures. Patient elected to proceed with cesarean delivery.  The risks, benefits, complications, treatment options, and exected outcomes were discussed with the patient . The patient dwith the proposed plan, giving informed consent. identified as Tammy [name] and the procedure verified as C-Section Delivery.  Procedure Details:  The patient was taken back to the operative suite where epidural anesthesia was found to be adequate.  Iodine  prep utilized.  A time out was held and the above information confirmed.   After induction of anesthesia, the patient was draped and prepped in the usual sterile manner and placed in a dorsal supine position with a leftward tilt. A Pfannenstiel incision was made and carried down through the subcutaneous tissue to the fascia. Fascial incision was made and bluntly extended transversely. The fascia was separated from the underlying rectus tissue superiorly and inferiorly. The peritoneum was identified and bluntly entered and extended longitudinally. Alexis retractor was placed. A bladder flap was not created. Dr. Barbette kindly elevated the vertex from below. A low transverse uterine incision was made as caudad as possible and extended bluntly. Vertex was brought to the hysterotomy but a hand and arm then slipped out and in attempting to reduce the fetus flipped to breech, so foot was grasped and delivered from breech presentation was a viable infant with Apgars and weight as above.  After waiting no seconds for delayed cord cutting given poor tone and lack of spontaneous breathing, the umbilical cord was clamped and cut cord blood was obtained for evaluation. Cord ph was sent. The placenta was removed Intact and appeared normal. The uterine outline, tubes and ovaries appeared normal though bilateral lateral extensions were noted. The uterine incision was closed with running locked sutures 0-Vicryl in one layer.   Hemostasis was observed after placement of one figure of eight 0 vicryl suture and an additional reinforcing suture at the right margin. Surgicel powder placed at the right hysterotomy margin. Urine noted to be pink tinged, no frank blood, and we were well away from the bladder. The rectus muscles were examined and hemostasis observed. The fascia was then reapproximated with running sutures  of 0-Vicryl. The subcuticular closure was performed with 2-0 plain gut. The skin was closed with 4-0  Vicryl.  Instrument, sponge, and needle counts were correct prior the abdominal closure and were correct at the conclusion of the case.     Disposition: PACU - hemodynamically stable.   Maternal Condition: stable       Signed: Devaughn KATHEE Ban, MD 01/20/2025 2:13 PM

## 2025-01-20 NOTE — Progress Notes (Signed)
 "  Labor Progress Note  Tammy [name] is a 34 y.o. female, G4P0120, IUP at 35.6 weeks weeks, presenting for PROM with cerclage in place. H/O Several losses, 4 weeks, 15.6 weeks in 2024, 17.4 weeks in 01/2024. Cerclage placed around 15wks. Type II DM.  CHTN- on Labetalol  100mg  BID. BMI 35. HSV +- Valtrex  at 36wks. Currently not taking valtrex , denies prodromal s/sx.  [facility] app[facility]/S trnsf back to [place]. FGR with femur noted at 30 weeks.  Not seen at [facility] since 25wks, seen at [facility]. H/O MDD/GAD/ADHD on zoloft  100mg . Alpha thal carrier. PCOS.   Subjective: Pt was sleeping well, pt continues and is now in active labor, discussed rupturing forbag, R/B/A and unable to fully see cxt, discussed IUPC placement with R/B/A, pt verbalizes consent for both, forebag rupture clear and tolerated well, IUPC placed with ease, MVU not adequate, plan to start pitocin  1x1  Patient Active Problem List   Diagnosis Date Noted   PROM (premature rupture of membranes) 01/19/2025   Cervical shortening, second trimester 10/18/2024   Alpha thalassemia silent carrier 10/15/2024   Fetal cardiac echogenic focus, antepartum 10/15/2024   Type 2 diabetes mellitus affecting pregnancy, antepartum 10/11/2024   Cervical cerclage suture present in second trimester 10/11/2024   Pregnancy 08/25/2024   Cervical insufficiency during pregnancy in third trimester, antepartum 08/25/2024   Alpha trait thalassemia 08/21/2024   Incompetence of cervix 02/05/2024   Bacterial vaginosis 02/05/2024   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   Preterm premature rupture of membranes (PPROM) delivered, current hospitalization 02/03/2024   Obesity affecting pregnancy, antepartum 12/25/2023   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 12/25/2023   Premature rupture of membranes 07/17/2023   IUFD at less than 20 weeks of gestation 07/17/2023   Migraine with aura and without status migrainosus, not intractable 02/10/2023   Genital  herpes simplex 08/28/2022   Chronic hypertension affecting pregnancy 08/25/2022   Mixed anxiety and depressive disorder 08/16/2022   Polycystic ovary syndrome 08/16/2022   Anxiety 08/19/2019   Attention deficit hyperactivity disorder 08/19/2019   Eczema 10/30/2017    Objective: BP 139/86   Pulse 78   Temp (P) 98.3 F (36.8 C) (Oral)   Resp 19   Ht 5' 8 (1.727 m)   Wt 115.7 kg   LMP 05/18/2024 (Exact Date)   SpO2 98%   BMI 38.77 kg/m  I/O last 3 completed shifts: In: -  Out: 1250 [Urine:1250] No intake/output data recorded. NST: FHR baseline 135 bpm, Variability: moderate, Accelerations:present, Decelerations:  Absent= Cat 1/Reactive CTX:  irregular, every 3-4 minutes Uterus gravid, soft non tender, moderate to palpate with contractions.  SVE:  Dilation: 6 Effacement (%): 90 Station: 0 Exam by:: Dyllan Hughett  CNM Pitocin  at 68mUn/min Bedside US  done to confirm vertex.   Assessment:  Tammy [name] is a 34 y.o. female, G4P0120, IUP at 35.6 weeks weeks, presenting for PROM with cerclage in place. H/O Several losses, 4 weeks, 15.6 weeks in 2024, 17.4 weeks in 01/2024. Cerclage placed around 15wks. Type II DM.  CHTN- on Labetalol  100mg  BID. BMI 35. HSV +- Valtrex  at 36wks. Currently not taking valtrex , denies prodromal s/sx.  [facility] app[facility]-W/S trnsf back to [place]. FGR with femur noted at 30 weeks.  Not seen at [facility] since 25wks, seen at [facility]. H/O MDD/GAD/ADHD on zoloft  100mg . Alpha thal carrier. PCOS.  Patient Active Problem List   Diagnosis Date Noted   PROM (premature rupture of membranes) 01/19/2025   Cervical shortening, second trimester 10/18/2024   Alpha thalassemia  silent carrier 10/15/2024   Fetal cardiac echogenic focus, antepartum 10/15/2024   Type 2 diabetes mellitus affecting pregnancy, antepartum 10/11/2024   Cervical cerclage suture present in second trimester 10/11/2024   Pregnancy 08/25/2024   Cervical insufficiency during pregnancy in third trimester,  antepartum 08/25/2024   Alpha trait thalassemia 08/21/2024   Incompetence of cervix 02/05/2024   Bacterial vaginosis 02/05/2024   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   Preterm premature rupture of membranes (PPROM) delivered, current hospitalization 02/03/2024   Obesity affecting pregnancy, antepartum 12/25/2023   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 12/25/2023   Premature rupture of membranes 07/17/2023   IUFD at less than 20 weeks of gestation 07/17/2023   Migraine with aura and without status migrainosus, not intractable 02/10/2023   Genital herpes simplex 08/28/2022   Chronic hypertension affecting pregnancy 08/25/2022   Mixed anxiety and depressive disorder 08/16/2022   Polycystic ovary syndrome 08/16/2022   Anxiety 08/19/2019   Attention deficit hyperactivity disorder 08/19/2019   Eczema 10/30/2017   NICHD: Category 1  Membranes:  PPROM , no s/s of infection   Induction:    Cytotec  xN/A  Foley Bulb: N/A  Pitocin  - 1  Pain management:               IV pain management: x N/A  Nitrous:              Epidural placement:  at 1721 on 2.4  GBS Pending: Presumptive negative.   Abx: PNC 2/4 @ 1808, 2127, at 2/4, on 2/5 @ 0345, 0737  A1GDM: stable sugars CBG (last 3)  Recent Labs    01/20/25 0517  GLUCAP 96    CHTN: continue labetalol  100mg  BID PO, asymptomatic, BP 132/85, pcr 0.1   Plan: Continue labor plan Continuous monitoring Rest Frequent position changes to facilitate fetal rotation and descent. Will reassess with cervical exam at 4 hours or earlier if necessary PCN G for GBS prophylaxis with PCR negative.  Expectant management  CHTN: continue labetalol  100mg  BID GDMA1: monitor CBG Q4H Valtrex  1000mg  daily for prophylactics.  Anticipate labor progression and vaginal delivery.   Mcguire Gasparyan  CNM, FNP-C, PMHNP-BC  [address]  [place], [place] [address]  Cell: [phone]  Office Phone: [phone] Fax:  [phone] 01/20/2025  9:31 AM    "

## 2025-01-20 NOTE — Progress Notes (Signed)
 Late entry  34 yo g4p0 @ 36+0, getting induced for pprom in the setting of t2dm and chtn. Deep recurrent late decels that did not respond to stopping pitocin , fluid bolus, position changes, and terbutaline . Found to be 9.5 cm dilated but cervix not reducible. Discussed risks and benefits of expectant management versus cesarean section and patient expressed strong interest in proceeding with cesarean delivery, quite reasonable in this situation.  The risks of cesarean section were discussed with the patient including but were not limited to: bleeding which may require transfusion or reoperation; infection which may require antibiotics; injury to bowel, bladder, ureters or other surrounding organs; injury to the fetus; need for additional procedures including hysterectomy in the event of a life-threatening hemorrhage; placental abnormalities wth subsequent pregnancies, incisional problems, thromboembolic phenomenon and other postoperative/anesthesia complications.  Declines tubal sterilization.   As deep variables did not improve with terbutaline , decision made to proceed emergently.

## 2025-01-20 NOTE — Progress Notes (Signed)
 "  Labor Progress Note  Tammy [name] is a 34 y.o. female, G4P0120, IUP at 35.6 weeks weeks, presenting for PROM with cerclage in place. H/O Several losses, 4 weeks, 15.6 weeks in 2024, 17.4 weeks in 01/2024. Cerclage placed around 15wks. Type II DM.  CHTN- on Labetalol  100mg  BID. BMI 35. HSV +- Valtrex  at 36wks. Currently not taking valtrex , denies prodromal s/sx.  [facility] app[facility]-W/S trnsf back to [place]. FGR with femur noted at 30 weeks.  Not seen at [facility] since 25wks, seen at [facility]. H/O MDD/GAD/ADHD on zoloft  100mg . Alpha thal carrier. PCOS.   Subjective: In to room, NRFHT, repetitive variables noted with a late competent, discussed pt just geittng re boluses by anesthesia, and became a little hypotensive, ephedrine  given,pitocin  turned off at 1234 when variables started, position changes with no resolution. terbutaline  given,  discussed FSE with pt at first pt declined then stated it was ok and placed with ease, DR [facility] in room to assess, pt 8 cm still swollen, tight cervix noted, DR Kandis offered PCS, pt desires PCS, R/B/A discussed, DR Kandis called urgent PCS for NRFHT CAT 2, offered services to assist, services declined, DR Kandis and DR Magali to proceed with urgent PCS>  Patient Active Problem List   Diagnosis Date Noted   PROM (premature rupture of membranes) 01/19/2025   Cervical shortening, second trimester 10/18/2024   Alpha thalassemia silent carrier 10/15/2024   Fetal cardiac echogenic focus, antepartum 10/15/2024   Type 2 diabetes mellitus affecting pregnancy, antepartum 10/11/2024   Cervical cerclage suture present in second trimester 10/11/2024   Pregnancy 08/25/2024   Cervical insufficiency during pregnancy in third trimester, antepartum 08/25/2024   Alpha trait thalassemia 08/21/2024   Incompetence of cervix 02/05/2024   Bacterial vaginosis 02/05/2024   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   Preterm premature rupture of membranes (PPROM) delivered, current hospitalization 02/03/2024    Obesity affecting pregnancy, antepartum 12/25/2023   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 12/25/2023   Premature rupture of membranes 07/17/2023   IUFD at less than 20 weeks of gestation 07/17/2023   Migraine with aura and without status migrainosus, not intractable 02/10/2023   Genital herpes simplex 08/28/2022   Chronic hypertension affecting pregnancy 08/25/2022   Mixed anxiety and depressive disorder 08/16/2022   Polycystic ovary syndrome 08/16/2022   Anxiety 08/19/2019   Attention deficit hyperactivity disorder 08/19/2019   Eczema 10/30/2017    Objective: BP 128/85   Pulse 94   Temp 98.4 F (36.9 C) (Axillary)   Resp 18   Ht 5' 8 (1.727 m)   Wt 115.7 kg   LMP 05/18/2024 (Exact Date)   SpO2 98%   BMI 38.77 kg/m  I/O last 3 completed shifts: In: -  Out: 1250 [Urine:1250] Total I/O In: 472 [P.O.:120; I.V.:1.2; Other:200.8; IV Piggyback:150] Out: 550 [Urine:550] NST: FHR baseline 135 bpm, Variability: moderate, Accelerations:present, Decelerations:  presented variables noted to nadir of 60s and repetitive, have late component to them, = Cat 2/Reactive CTX:  regular, every 2-3 minutes Uterus gravid, soft non tender, moderate to palpate with contractions.  SVE:  Dilation: 6 Effacement (%): 90 Station: 0, +1 Exam by:: Vernell Gentry RN Pitocin  at was on 2 and turned off mUn/min Bedside US  done to confirm vertex.   Assessment:  Tammy [name] is a 34 y.o. female, G4P0120, IUP at 35.6 weeks weeks, presenting for PROM with cerclage in place. H/O Several losses, 4 weeks, 15.6 weeks in 2024, 17.4 weeks in 01/2024. Cerclage placed around 15wks. Type II  DM.  CHTN- on Labetalol  100mg  BID. BMI 35. HSV +- Valtrex  at 36wks. Currently not taking valtrex , denies prodromal s/sx.  [facility]/[place] trnsf back [place]. FGR with femur noted at 30 weeks.  Not seen at [facility] since 25wks, seen at [facility]. H/O MDD/GAD/ADHD on zoloft  100mg . Alpha thal carrier. PCOS.   Patient Active Problem List   Diagnosis Date Noted   PROM (premature rupture of membranes) 01/19/2025   Cervical shortening, second trimester 10/18/2024   Alpha thalassemia silent carrier 10/15/2024   Fetal cardiac echogenic focus, antepartum 10/15/2024   Type 2 diabetes mellitus affecting pregnancy, antepartum 10/11/2024   Cervical cerclage suture present in second trimester 10/11/2024   Pregnancy 08/25/2024   Cervical insufficiency during pregnancy in third trimester, antepartum 08/25/2024   Alpha trait thalassemia 08/21/2024   Incompetence of cervix 02/05/2024   Bacterial vaginosis 02/05/2024   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   Preterm premature rupture of membranes (PPROM) delivered, current hospitalization 02/03/2024   Obesity affecting pregnancy, antepartum 12/25/2023   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 12/25/2023   Premature rupture of membranes 07/17/2023   IUFD at less than 20 weeks of gestation 07/17/2023   Migraine with aura and without status migrainosus, not intractable 02/10/2023   Genital herpes simplex 08/28/2022   Chronic hypertension affecting pregnancy 08/25/2022   Mixed anxiety and depressive disorder 08/16/2022   Polycystic ovary syndrome 08/16/2022   Anxiety 08/19/2019   Attention deficit hyperactivity disorder 08/19/2019   Eczema 10/30/2017   NICHD: Category 2, attempted posion changes, fluid bolus, fse place, pitocin  turned off, and terb given, ephedrine  given after epidural re bolus.   Membranes:  PPROM , no s/s of infection   Induction:    Cytotec  xN/A  Foley Bulb: N/A  Pitocin  - 2 and turned off  Pain management:               IV pain management: x N/A  Nitrous:              Epidural placement:  at 1721 on 2.4  GBS Pending: Presumptive negative.   Abx: PNC 2/4 @ 1808, 2127, at 2/4, on 2/5 @ 0345, 0737  A1GDM: stable sugars CBG (last 3)  Recent Labs    01/20/25 0517 01/20/25 0936  GLUCAP 96 73    CHTN:  continue labetalol  100mg  BID PO, asymptomatic, BP 132/85, pcr 0.1   Plan: Proceed with urgent PCS for NRFHT Pit off terb  DR [facility] called urgent PCS  Cong Hightower  CNM, FNP-C, PMHNP-BC  [address]  [place], [place] [address]  Cell: [phone]  Office Phone: [phone] Fax: [phone] 01/20/2025  1:19 PM    "

## 2025-01-21 ENCOUNTER — Encounter (HOSPITAL_COMMUNITY): Payer: Self-pay | Admitting: Obstetrics and Gynecology

## 2025-01-21 LAB — COMPREHENSIVE METABOLIC PANEL WITH GFR
ALT: 15 U/L (ref 0–44)
AST: 37 U/L (ref 15–41)
Albumin: 3.3 g/dL — ABNORMAL LOW (ref 3.5–5.0)
Alkaline Phosphatase: 127 U/L — ABNORMAL HIGH (ref 38–126)
Anion gap: 10 (ref 5–15)
BUN: 10 mg/dL (ref 6–20)
CO2: 20 mmol/L — ABNORMAL LOW (ref 22–32)
Calcium: 9.2 mg/dL (ref 8.9–10.3)
Chloride: 105 mmol/L (ref 98–111)
Creatinine, Ser: 0.83 mg/dL (ref 0.44–1.00)
GFR, Estimated: >60 mL/min
Glucose, Bld: 79 mg/dL (ref 70–99)
Potassium: 4.4 mmol/L (ref 3.5–5.1)
Sodium: 135 mmol/L (ref 135–145)
Total Bilirubin: 0.2 mg/dL (ref 0.0–1.2)
Total Protein: 6.1 g/dL — ABNORMAL LOW (ref 6.5–8.1)

## 2025-01-21 LAB — GLUCOSE, CAPILLARY: Glucose-Capillary: 86 mg/dL (ref 70–99)

## 2025-01-21 LAB — CBC
HCT: 28.5 % — ABNORMAL LOW (ref 36.0–46.0)
Hemoglobin: 9.3 g/dL — ABNORMAL LOW (ref 12.0–15.0)
MCH: 27.4 pg (ref 26.0–34.0)
MCHC: 32.6 g/dL (ref 30.0–36.0)
MCV: 84.1 fL (ref 80.0–100.0)
Platelets: 318 10*3/uL (ref 150–400)
RBC: 3.39 MIL/uL — ABNORMAL LOW (ref 3.87–5.11)
RDW: 14 % (ref 11.5–15.5)
WBC: 19.8 10*3/uL — ABNORMAL HIGH (ref 4.0–10.5)
nRBC: 0 % (ref 0.0–0.2)

## 2025-01-21 MED ORDER — NITROFURANTOIN MONOHYD MACRO 100 MG PO CAPS: 100.0000 mg | ORAL_CAPSULE | Freq: Two times a day (BID) | ORAL | Status: DC

## 2025-01-21 MED ORDER — CEFADROXIL 500 MG PO CAPS
500.0000 mg | ORAL_CAPSULE | Freq: Two times a day (BID) | ORAL | Status: AC
  Filled 2025-01-21: qty 1

## 2025-01-21 MED ORDER — OXYCODONE HCL 5 MG PO TABS
10.0000 mg | ORAL_TABLET | ORAL | Status: AC | PRN
  Administered 2025-01-21: 10 mg via ORAL
  Filled 2025-01-21: qty 2

## 2025-01-21 MED ORDER — GABAPENTIN 300 MG PO CAPS
300.0000 mg | ORAL_CAPSULE | Freq: Three times a day (TID) | ORAL | Status: AC
  Administered 2025-01-21 (×2): 300 mg via ORAL
  Filled 2025-01-21 (×2): qty 1

## 2025-01-21 NOTE — Progress Notes (Signed)
 Encouraged pt to increase her fluid intake and to void again. Pt reports that she has not been wanting to move d/t her pain increasing today. Explained to pt the importance of staying hydrated and emptying her bladder frequently. Pt expressed understanding and stated that she is going to try to take a shower soon. Encouraged pt to attempt to void as well.

## 2025-01-21 NOTE — Lactation Note (Signed)
 This note was copied from a baby's chart. Lactation Consultation Note  Patient Name: Tammy [name] Today's Date: 01/21/2025 Age:32 hours   P2- MOB was asleep when LC attempted to consult with her. [facility] team will try again at a later time.   Recardo Hoit BS, IBCLC 01/21/2025, 9:16 PM

## 2025-01-21 NOTE — Anesthesia Postprocedure Evaluation (Signed)
"   Anesthesia Post Note  Patient: Tammy [name]  Procedure(s) Performed: CESAREAN DELIVERY     Patient location during evaluation: Mother Baby Anesthesia Type: Epidural Level of consciousness: awake and alert Pain management: pain level controlled Vital Signs Assessment: post-procedure vital signs reviewed and stable Respiratory status: spontaneous breathing, nonlabored ventilation and respiratory function stable Cardiovascular status: stable Postop Assessment: no headache, no backache and epidural receding Anesthetic complications: no   No notable events documented.  Last Vitals:  Vitals:   01/21/25 0253 01/21/25 0624  BP: 127/67 128/86  Pulse: 100 (!) 101  Resp: 18 17  Temp: 36.6 C 37 C  SpO2: 95% 97%    Last Pain:  Vitals:   01/21/25 0819  TempSrc:   PainSc: 4                  [name] P Judythe Postema      "

## 2025-01-21 NOTE — Progress Notes (Addendum)
 Subjective: POD# 1 Information for the patient's newborn:  Tammy [name], Tammy [name] [[id number]]  female  [name]  Reports feeling sore and tired Feeding: breast and formula Reports tolerating PO and denies N/V, foley removed, ambulating and urinating w/o difficulty  Pain controlled with PO meds Denies HA/SOB/dizziness  Passing flatus yes Vaginal bleeding is normal, no clots    Tammy [name] was being treated for a UTI with Macrobid  prior to admission. She had completed 3 days of a 10 day prescription. Macrobid  to be re-ordered.   Objective:  VS:  Vitals:   01/20/25 2248 01/21/25 0253 01/21/25 0624 01/21/25 1347  BP:  127/67 128/86 118/73  Pulse:  100 (!) 101 94  Resp: 17 18 17 17   Temp: 98.5 F (36.9 C) 97.8 F (36.6 C) 98.6 F (37 C) 98.5 F (36.9 C)  TempSrc: Oral Oral Oral Oral  SpO2: 97% 95% 97% 99%  Weight:      Height:        Intake/Output Summary (Last 24 hours) at 01/21/2025 1500 Last data filed at 01/21/2025 1220 Gross per 24 hour  Intake --  Output 2245 ml  Net -2245 ml     Recent Labs    01/19/25 1456 01/21/25 0605  WBC 8.0 19.8*  HGB 10.9* 9.3*  HCT 34.2* 28.5*  PLT 305 318    Blood type: --/--/A POS (02/04 1455) Rubella: Immune (08/21 0000)    Physical Exam:  General: alert, cooperative, and no distress CV: Regular rate and rhythm or without murmur or extra heart sounds Resp: clear Abdomen: soft, nontender, normal bowel sounds Incision: clean, dry, and intact Perineum:  Uterine Fundus: firm, below umbilicus, nontender Lochia: minimal Ext: trace edema, negative for tenderness, pain, and cords   Assessment/Plan: 34 y.o.   POD# 1. [id number]                  Principal Problem:   PROM (premature rupture of membranes) Active Problems:   S/P cesarean section   Non-reassuring electronic fetal monitoring tracing   Normal postpartum course   Routine post-op PP care          Advance diet as tolerated Advised warm fluids and ambulation to  improve GI motility Adjusted pain meds to improve comfort Lactation support PRN Contraception: did not ask Anticipate D/C 01/23/25  Tammy KATHEE Peal, DNP, CNM 01/21/2025, 3:00 PM

## 2025-02-17 ENCOUNTER — Inpatient Hospital Stay (HOSPITAL_COMMUNITY): Admit: 2025-02-17
